# Patient Record
Sex: Female | Born: 1935 | Race: White | Hispanic: No | Smoking: Never smoker
Health system: Southern US, Community
[De-identification: ages and names within clinical notes are randomized; demographics above are authoritative.]

## PROBLEM LIST (undated history)

## (undated) DIAGNOSIS — K862 Cyst of pancreas: Secondary | ICD-10-CM

## (undated) DIAGNOSIS — I10 Essential (primary) hypertension: Secondary | ICD-10-CM

## (undated) DIAGNOSIS — E785 Hyperlipidemia, unspecified: Secondary | ICD-10-CM

## (undated) DIAGNOSIS — G4733 Obstructive sleep apnea (adult) (pediatric): Secondary | ICD-10-CM

## (undated) DIAGNOSIS — E039 Hypothyroidism, unspecified: Secondary | ICD-10-CM

## (undated) DIAGNOSIS — N182 Chronic kidney disease, stage 2 (mild): Secondary | ICD-10-CM

## (undated) DIAGNOSIS — C50919 Malignant neoplasm of unspecified site of unspecified female breast: Secondary | ICD-10-CM

## (undated) DIAGNOSIS — I340 Nonrheumatic mitral (valve) insufficiency: Secondary | ICD-10-CM

## (undated) HISTORY — DX: Cyst of pancreas: K86.2

## (undated) HISTORY — DX: Obstructive sleep apnea (adult) (pediatric): G47.33

## (undated) HISTORY — DX: Nonrheumatic mitral (valve) insufficiency: I34.0

## (undated) HISTORY — PX: BREAST SURGERY: SHX581

## (undated) HISTORY — DX: Essential (primary) hypertension: I10

## (undated) HISTORY — DX: Malignant neoplasm of unspecified site of unspecified female breast: C50.919

## (undated) HISTORY — DX: Hypothyroidism, unspecified: E03.9

## (undated) HISTORY — DX: Hyperlipidemia, unspecified: E78.5

## (undated) HISTORY — DX: Chronic kidney disease, stage 2 (mild): N18.2

---

## 2019-10-10 ENCOUNTER — Inpatient Hospital Stay: Payer: Medicare Other | Attending: Oncology | Admitting: Oncology

## 2019-10-10 ENCOUNTER — Other Ambulatory Visit: Payer: Self-pay

## 2019-10-10 ENCOUNTER — Inpatient Hospital Stay: Payer: Medicare Other

## 2019-10-10 VITALS — BP 127/90 | HR 80 | Temp 97.3°F | Resp 16 | Wt 117.4 lb

## 2019-10-10 DIAGNOSIS — Z7901 Long term (current) use of anticoagulants: Secondary | ICD-10-CM | POA: Insufficient documentation

## 2019-10-10 DIAGNOSIS — R222 Localized swelling, mass and lump, trunk: Secondary | ICD-10-CM | POA: Diagnosis not present

## 2019-10-10 DIAGNOSIS — F0390 Unspecified dementia without behavioral disturbance: Secondary | ICD-10-CM | POA: Insufficient documentation

## 2019-10-10 DIAGNOSIS — N189 Chronic kidney disease, unspecified: Secondary | ICD-10-CM | POA: Insufficient documentation

## 2019-10-10 DIAGNOSIS — Z853 Personal history of malignant neoplasm of breast: Secondary | ICD-10-CM | POA: Insufficient documentation

## 2019-10-10 DIAGNOSIS — I82A11 Acute embolism and thrombosis of right axillary vein: Secondary | ICD-10-CM

## 2019-10-10 DIAGNOSIS — I82621 Acute embolism and thrombosis of deep veins of right upper extremity: Secondary | ICD-10-CM

## 2019-10-10 DIAGNOSIS — I129 Hypertensive chronic kidney disease with stage 1 through stage 4 chronic kidney disease, or unspecified chronic kidney disease: Secondary | ICD-10-CM

## 2019-10-10 DIAGNOSIS — E039 Hypothyroidism, unspecified: Secondary | ICD-10-CM | POA: Diagnosis not present

## 2019-10-10 DIAGNOSIS — R2231 Localized swelling, mass and lump, right upper limb: Secondary | ICD-10-CM

## 2019-10-10 DIAGNOSIS — Z79899 Other long term (current) drug therapy: Secondary | ICD-10-CM | POA: Diagnosis not present

## 2019-10-10 NOTE — Progress Notes (Signed)
Hematology/Oncology Consult note [facility] [phone] [phone]  Patient Care Team: Clide Deutscher, [place] as PCP - General (General Practice)   Name of the patient: Savannah Galvan  [id number]  Jan 03, 1930    Reason for referral-history of breast cancer now with right axillary mass   Referring physician-Kathy Ronnald Ramp, [place]  Date of visit: 10/10/19   History of presenting illness- Patient is a 86 year old female with a past medical history significant for hypertension hypothyroidism dementia CKD and history of breast cancer.  She has a history of right upper extremity lymphedema and was noted to have significant right upper extremity swelling since April 2021.  She underwent right upper extremity/axillary ultrasound which showed DVT involving the right subclavian and basilic veins.  Echogenic density noted in the right axilla 3.8 cm.  She has been referred for further management.Labs from June 2021 revealed normal CMP.  CBC at that time was also unremarkable.  Patient lives at an assisted living in [place] and has some dementia and does not remember all of her history.  Her healthcare power of attorney who is her grandniece is with her today.  Patient does not have any children of her own.  She has 2 other sisters were also involved in her healthcare and one of them lives in [place].  They both did not have any details of her breast cancer which was about 15 to 20 years ago.  Patient does not remember getting any chemotherapy.  She does not remember going on any hormone therapy either.  We do not have her prior medical history and medication list for [place] to review at the time of our visit.  ECOG PS- 1  Pain scale- 0   Review of systems- Review of Systems  Constitutional: Negative for chills, fever, malaise/fatigue and weight loss.  HENT: Negative for congestion, ear discharge and nosebleeds.   Eyes: Negative for blurred vision.  Respiratory:  Negative for cough, hemoptysis, sputum production, shortness of breath and wheezing.   Cardiovascular: Negative for chest pain, palpitations, orthopnea and claudication.  Gastrointestinal: Negative for abdominal pain, blood in stool, constipation, diarrhea, heartburn, melena, nausea and vomiting.  Genitourinary: Negative for dysuria, flank pain, frequency, hematuria and urgency.  Musculoskeletal: Negative for back pain, joint pain and myalgias.       Right arm swelling  Skin: Negative for rash.  Neurological: Negative for dizziness, tingling, focal weakness, seizures, weakness and headaches.  Endo/Heme/Allergies: Does not bruise/bleed easily.  Psychiatric/Behavioral: Negative for depression and suicidal ideas. The patient does not have insomnia.     Not on File  There are no problems to display for this patient.    No past medical history on file.   The histories are not reviewed yet. Please review them in the "History" navigator section and refresh this [place].  Social History   Socioeconomic History  . Marital status: Widowed    Spouse name: Not on file  . Number of children: Not on file  . Years of education: Not on file  . Highest education level: Not on file  Occupational History  . Not on file  Tobacco Use  . Smoking status: Not on file  Substance and Sexual Activity  . Alcohol use: Not on file  . Drug use: Not on file  . Sexual activity: Not on file  Other Topics Concern  . Not on file  Social History Narrative  . Not on file   Social Determinants of Health   Financial Resource Strain:   .  Difficulty of Paying Living Expenses:   Food Insecurity:   . Worried About Charity fundraiser in the Last Year:   . Arboriculturist in the Last Year:   Transportation Needs:   . Film/video editor (Medical):   Marland Kitchen Lack of Transportation (Non-Medical):   Physical Activity:   . Days of Exercise per Week:   . Minutes of Exercise per Session:   Stress:   . Feeling  of Stress :   Social Connections:   . Frequency of Communication with Friends and Family:   . Frequency of Social Gatherings with Friends and Family:   . Attends Religious Services:   . Active Member of Clubs or Organizations:   . Attends Archivist Meetings:   Marland Kitchen Marital Status:   Intimate Partner Violence:   . Fear of Current or Ex-Partner:   . Emotionally Abused:   Marland Kitchen Physically Abused:   . Sexually Abused:      No family history on file.   Current Outpatient Medications:  .  atorvastatin (LIPITOR) 10 MG tablet, Take 10 mg by mouth daily., Disp: , Rfl:  .  ELIQUIS 5 MG TABS tablet, Take by mouth., Disp: , Rfl:  .  ezetimibe (ZETIA) 10 MG tablet, Take 10 mg by mouth daily., Disp: , Rfl:  .  hydrochlorothiazide (HYDRODIURIL) 25 MG tablet, Take 25 mg by mouth daily., Disp: , Rfl:  .  losartan (COZAAR) 50 MG tablet, Take 50 mg by mouth 2 (two) times daily., Disp: , Rfl:  .  mupirocin ointment (BACTROBAN) 2 %, Apply 1 application topically 3 (three) times daily., Disp: , Rfl:  .  MYRBETRIQ 50 MG TB24 tablet, Take 50 mg by mouth daily., Disp: , Rfl:    Physical exam:  Vitals:   10/10/19 1406  BP: 127/90  Pulse: 80  Resp: 16  Temp: (!) 97.3 F (36.3 C)  TempSrc: Tympanic  SpO2: 98%  Weight: 117 lb 6.4 oz (53.3 kg)   Physical Exam Constitutional:      General: She is not in acute distress. Cardiovascular:     Rate and Rhythm: Normal rate and regular rhythm.     Heart sounds: Normal heart sounds.  Pulmonary:     Effort: Pulmonary effort is normal.     Breath sounds: Normal breath sounds.  Abdominal:     General: Bowel sounds are normal.     Palpations: Abdomen is soft.  Musculoskeletal:     Comments: Right upper extremities is significantly more swollen than the left.  There are also superficial skin nodules noted over the area of the right shoulder joint medially.  Skin:    General: Skin is warm and dry.  Neurological:     Mental Status: She is alert and  oriented to person, place, and time.   Patient is s/p bilateral mastectomy without reconstruction.  There is no evidence of chest wall recurrence.  I cannot palpate any distinct right axillary mass.  No left axillary adenopathy.    No flowsheet data found. No flowsheet data found.  No images are attached to the encounter.  No results found.  Assessment and plan- Patient is a 86 y.o. female with prior history of breast cancer now presenting with right upper extremity DVT and right axillary mass  Right axillary mass: Patient has multiple skin nodules noted over her right shoulder which is concerning for recurrent malignancy.  She will need an ultrasound-guided biopsy for this which we will arrange.  We'll also  touch base with IR if she needs to come off Xarelto for the procedure.  I will obtain a PET CT scan given her prior history of breast cancer and ongoing concerns of recurrent malignancy  Patient will continue Xarelto for right subclavian and basilic vein thrombosis  I will see her after her PET scan and biopsy results are back to discuss further management   Thank you for this kind referral and the opportunity to participate in the care of this patient   Visit Diagnosis 1. Axillary mass, right   2. Acute deep vein thrombosis (DVT) of axillary vein of right upper extremity (HCC)     Dr. Randa Evens, MD, MPH [facility] [phone] 10/10/2019

## 2019-10-11 ENCOUNTER — Encounter: Payer: Self-pay | Admitting: Oncology

## 2019-10-15 ENCOUNTER — Encounter: Payer: Self-pay | Admitting: Oncology

## 2019-10-15 ENCOUNTER — Other Ambulatory Visit: Payer: Self-pay

## 2019-10-21 ENCOUNTER — Other Ambulatory Visit: Payer: Self-pay | Admitting: Oncology

## 2019-10-21 ENCOUNTER — Other Ambulatory Visit: Payer: Self-pay | Admitting: *Deleted

## 2019-10-21 ENCOUNTER — Ambulatory Visit
Admission: RE | Admit: 2019-10-21 | Discharge: 2019-10-21 | Disposition: A | Discharge status: Home / Self Care | Payer: Medicare Other | Source: Ambulatory Visit | Attending: Oncology | Admitting: Oncology

## 2019-10-21 ENCOUNTER — Other Ambulatory Visit: Payer: Self-pay

## 2019-10-21 DIAGNOSIS — Z853 Personal history of malignant neoplasm of breast: Secondary | ICD-10-CM | POA: Insufficient documentation

## 2019-10-21 DIAGNOSIS — J90 Pleural effusion, not elsewhere classified: Secondary | ICD-10-CM | POA: Insufficient documentation

## 2019-10-21 DIAGNOSIS — R2231 Localized swelling, mass and lump, right upper limb: Secondary | ICD-10-CM | POA: Insufficient documentation

## 2019-10-21 DIAGNOSIS — R234 Changes in skin texture: Secondary | ICD-10-CM | POA: Diagnosis not present

## 2019-10-21 LAB — GLUCOSE, CAPILLARY: Glucose-Capillary: 100 mg/dL — ABNORMAL HIGH (ref 70–99)

## 2019-10-21 MED ORDER — FLUDEOXYGLUCOSE F - 18 (FDG) INJECTION
6.1000 | Freq: Once | INTRAVENOUS | Status: AC | PRN
  Administered 2019-10-21: 5.99 via INTRAVENOUS

## 2019-10-21 NOTE — Progress Notes (Signed)
Tumor bo

## 2019-10-22 ENCOUNTER — Telehealth: Payer: Self-pay | Admitting: *Deleted

## 2019-10-22 NOTE — Progress Notes (Signed)
I have requested to have a thoracentesis. She needs a covid test done and her niece will get her there tom. I have called the pre admit and got her on the list for the test. I have called janet and left her a message if she can have the thoracentesis Friday afternoon. I am also working on talking to Dr. Dahlia Byes about biopsy for the pt.

## 2019-10-22 NOTE — Telephone Encounter (Signed)
Dr. Janese Banks has asked me to call the niece and speak to her because she is the healthcare power of attorney and determine whether or not she is okay with patient having a thoracentesis because she has a pleural effusion.  We are also still looking into getting a biopsy of the nodules that is on her right arm.  Interventional radiologist feels like that that is something that they would not be able to do so we are now getting in touch with the surgeon.  I have asked the niece to please call me so we can discuss this matter and see how we can move forward.

## 2019-10-23 ENCOUNTER — Other Ambulatory Visit: Payer: Self-pay

## 2019-10-23 ENCOUNTER — Other Ambulatory Visit
Admission: RE | Admit: 2019-10-23 | Discharge: 2019-10-23 | Disposition: A | Discharge status: Home / Self Care | Payer: Medicare Other | Source: Ambulatory Visit | Attending: Oncology | Admitting: Oncology

## 2019-10-23 ENCOUNTER — Telehealth: Payer: Self-pay | Admitting: *Deleted

## 2019-10-23 DIAGNOSIS — Z20822 Contact with and (suspected) exposure to covid-19: Secondary | ICD-10-CM | POA: Diagnosis not present

## 2019-10-23 DIAGNOSIS — Z01812 Encounter for preprocedural laboratory examination: Secondary | ICD-10-CM | POA: Insufficient documentation

## 2019-10-23 LAB — SARS CORONAVIRUS 2 (TAT 6-24 HRS): SARS Coronavirus 2: NEGATIVE

## 2019-10-23 NOTE — Telephone Encounter (Signed)
I called and spoke to [facility], and told her that we are suppose to get the covid test back by lunch time. We have made an appt for tom arrive [facility]. She can eat and drink they will give her local anesthesia to the sire the needle will go in. Meghan will tell her grandmother and she will bring her tom. If we do not get results or covid or if it is positive , I will call Meghan back. She is agreeable to this plan

## 2019-10-24 ENCOUNTER — Other Ambulatory Visit: Payer: Self-pay | Admitting: *Deleted

## 2019-10-24 ENCOUNTER — Ambulatory Visit
Admission: RE | Admit: 2019-10-24 | Discharge: 2019-10-24 | Disposition: A | Discharge status: Home / Self Care | Payer: Medicare Other | Source: Ambulatory Visit | Attending: Interventional Radiology | Admitting: Interventional Radiology

## 2019-10-24 ENCOUNTER — Ambulatory Visit
Admission: RE | Admit: 2019-10-24 | Discharge: 2019-10-24 | Disposition: A | Discharge status: Home / Self Care | Payer: Medicare Other | Source: Ambulatory Visit | Attending: Oncology | Admitting: Oncology

## 2019-10-24 DIAGNOSIS — J90 Pleural effusion, not elsewhere classified: Secondary | ICD-10-CM | POA: Diagnosis present

## 2019-10-24 DIAGNOSIS — Z9889 Other specified postprocedural states: Secondary | ICD-10-CM

## 2019-10-24 NOTE — Procedures (Signed)
Interventional Radiology Procedure Note  Procedure: US guided right thoracentesis  Complications: None  Estimated Blood Loss: None  Findings: 1.5 L of yellow fluid removed from right pleural space. Post CXR pending.  Venetia Night. Kathlene Cote, M.D Pager:  [phone]

## 2019-10-28 ENCOUNTER — Telehealth: Payer: Self-pay | Admitting: *Deleted

## 2019-10-28 NOTE — Telephone Encounter (Signed)
I got information from dr byrnett and he said that she does not have to be NPO for bx. , she does not need to hold her blood thinners. Savannah Galvan will be called from [facility] clinic once her aunt is in the office to get verbal consent. Her sister will bring her tom at 2:15.Savannah Galvan is agreeable to this

## 2019-10-29 ENCOUNTER — Other Ambulatory Visit: Payer: Self-pay | Admitting: Oncology

## 2019-10-29 ENCOUNTER — Telehealth: Payer: Self-pay | Admitting: Internal Medicine

## 2019-10-29 NOTE — Telephone Encounter (Signed)
On 8/04-I spoke to Dr. Augustine Radar pleural fluid positive for malignancy; suggestive of breast cancer. Receptor studies pending.  Reviewed the records; given the diagnosis of malignancy, I agree with Dr. Tollie Pizza to hold off skin biopsy. However, as per Dr. Tollie Pizza skin biopsy could be done if needed.   GB

## 2019-11-03 LAB — CYTOLOGY - NON PAP

## 2019-11-06 ENCOUNTER — Other Ambulatory Visit: Payer: Federal, State, Local not specified - PPO

## 2019-11-06 ENCOUNTER — Inpatient Hospital Stay: Payer: Medicare Other | Attending: Oncology | Admitting: Oncology

## 2019-11-06 ENCOUNTER — Encounter: Payer: Self-pay | Admitting: Oncology

## 2019-11-06 ENCOUNTER — Other Ambulatory Visit: Payer: Self-pay

## 2019-11-06 ENCOUNTER — Inpatient Hospital Stay: Payer: Medicare Other

## 2019-11-06 VITALS — BP 101/61 | HR 81 | Temp 98.2°F | Resp 16 | Ht 66.0 in | Wt 116.5 lb

## 2019-11-06 DIAGNOSIS — C778 Secondary and unspecified malignant neoplasm of lymph nodes of multiple regions: Secondary | ICD-10-CM | POA: Insufficient documentation

## 2019-11-06 DIAGNOSIS — R234 Changes in skin texture: Secondary | ICD-10-CM | POA: Insufficient documentation

## 2019-11-06 DIAGNOSIS — Z7901 Long term (current) use of anticoagulants: Secondary | ICD-10-CM | POA: Insufficient documentation

## 2019-11-06 DIAGNOSIS — Z885 Allergy status to narcotic agent status: Secondary | ICD-10-CM | POA: Diagnosis not present

## 2019-11-06 DIAGNOSIS — E039 Hypothyroidism, unspecified: Secondary | ICD-10-CM | POA: Diagnosis not present

## 2019-11-06 DIAGNOSIS — R5383 Other fatigue: Secondary | ICD-10-CM | POA: Diagnosis not present

## 2019-11-06 DIAGNOSIS — I129 Hypertensive chronic kidney disease with stage 1 through stage 4 chronic kidney disease, or unspecified chronic kidney disease: Secondary | ICD-10-CM | POA: Insufficient documentation

## 2019-11-06 DIAGNOSIS — I82621 Acute embolism and thrombosis of deep veins of right upper extremity: Secondary | ICD-10-CM | POA: Diagnosis not present

## 2019-11-06 DIAGNOSIS — Z9013 Acquired absence of bilateral breasts and nipples: Secondary | ICD-10-CM | POA: Insufficient documentation

## 2019-11-06 DIAGNOSIS — Z7189 Other specified counseling: Secondary | ICD-10-CM

## 2019-11-06 DIAGNOSIS — F0280 Dementia in other diseases classified elsewhere without behavioral disturbance: Secondary | ICD-10-CM | POA: Insufficient documentation

## 2019-11-06 DIAGNOSIS — C50919 Malignant neoplasm of unspecified site of unspecified female breast: Secondary | ICD-10-CM

## 2019-11-06 DIAGNOSIS — N182 Chronic kidney disease, stage 2 (mild): Secondary | ICD-10-CM | POA: Insufficient documentation

## 2019-11-06 DIAGNOSIS — Z79899 Other long term (current) drug therapy: Secondary | ICD-10-CM | POA: Insufficient documentation

## 2019-11-06 DIAGNOSIS — I700 Atherosclerosis of aorta: Secondary | ICD-10-CM | POA: Insufficient documentation

## 2019-11-06 DIAGNOSIS — J910 Malignant pleural effusion: Secondary | ICD-10-CM | POA: Diagnosis not present

## 2019-11-06 DIAGNOSIS — K7689 Other specified diseases of liver: Secondary | ICD-10-CM | POA: Insufficient documentation

## 2019-11-06 DIAGNOSIS — R0602 Shortness of breath: Secondary | ICD-10-CM | POA: Diagnosis not present

## 2019-11-06 DIAGNOSIS — M7989 Other specified soft tissue disorders: Secondary | ICD-10-CM | POA: Diagnosis not present

## 2019-11-06 DIAGNOSIS — Z170 Estrogen receptor positive status [ER+]: Secondary | ICD-10-CM | POA: Diagnosis not present

## 2019-11-06 LAB — COMPREHENSIVE METABOLIC PANEL
ALT: 36 U/L (ref 0–44)
AST: 33 U/L (ref 15–41)
Albumin: 3.5 g/dL (ref 3.5–5.0)
Alkaline Phosphatase: 87 U/L (ref 38–126)
Anion gap: 10 (ref 5–15)
BUN: 15 mg/dL (ref 8–23)
CO2: 28 mmol/L (ref 22–32)
Calcium: 9.2 mg/dL (ref 8.9–10.3)
Chloride: 95 mmol/L — ABNORMAL LOW (ref 98–111)
Creatinine, Ser: 0.66 mg/dL (ref 0.44–1.00)
GFR calc Af Amer: >60 mL/min (ref >60)
GFR calc non Af Amer: >60 mL/min (ref >60)
Glucose, Bld: 131 mg/dL — ABNORMAL HIGH (ref 70–99)
Potassium: 4 mmol/L (ref 3.5–5.1)
Sodium: 133 mmol/L — ABNORMAL LOW (ref 135–145)
Total Bilirubin: 0.7 mg/dL (ref 0.3–1.2)
Total Protein: 6.9 g/dL (ref 6.5–8.1)

## 2019-11-06 LAB — CBC WITH DIFFERENTIAL/PLATELET
Abs Immature Granulocytes: 0.03 10*3/uL (ref 0.00–0.07)
Basophils Absolute: 0.1 10*3/uL (ref 0.0–0.1)
Basophils Relative: 1 %
Eosinophils Absolute: 0.3 10*3/uL (ref 0.0–0.5)
Eosinophils Relative: 3 %
HCT: 41.4 % (ref 36.0–46.0)
Hemoglobin: 14.3 g/dL (ref 12.0–15.0)
Immature Granulocytes: 0 %
Lymphocytes Relative: 15 %
Lymphs Abs: 1.3 10*3/uL (ref 0.7–4.0)
MCH: 31.2 pg (ref 26.0–34.0)
MCHC: 34.5 g/dL (ref 30.0–36.0)
MCV: 90.2 fL (ref 80.0–100.0)
Monocytes Absolute: 0.6 10*3/uL (ref 0.1–1.0)
Monocytes Relative: 7 %
Neutro Abs: 6.3 10*3/uL (ref 1.7–7.7)
Neutrophils Relative %: 74 %
Platelets: 352 10*3/uL (ref 150–400)
RBC: 4.59 MIL/uL (ref 3.87–5.11)
RDW: 13.3 % (ref 11.5–15.5)
WBC: 8.5 10*3/uL (ref 4.0–10.5)
nRBC: 0 % (ref 0.0–0.2)

## 2019-11-06 MED ORDER — LETROZOLE 2.5 MG PO TABS
2.5000 mg | ORAL_TABLET | Freq: Every day | ORAL | 3 refills | Status: DC
Start: 2019-11-06 — End: 2019-12-11

## 2019-11-06 NOTE — Progress Notes (Signed)
Pt states and her sister also that the right arm with clot and a mass in it that it is better looking today as far as swelling decreased

## 2019-11-06 NOTE — Progress Notes (Signed)
Hematology/Oncology Consult note [facility]  [phone] [phone]  Patient Care Team: Clide Deutscher, [place] as PCP - General (General Practice)   Name of the patient: Savannah Galvan  [id number]  12-19-1929   Date of visit: 11/06/19  Diagnosis-metastatic ER positive breast cancer with metastases to mediastinal lymph nodes, bilateral pleural effusion, pectoralis mass causing right upper extremity DVT  Chief complaint/ Reason for visit-discuss pathology results and further management  Heme/Onc history: Patient is a 86 year old female with a past medical history significant for hypertension hypothyroidism dementia CKD and history of breast cancer.  She has a history of right upper extremity lymphedema and was noted to have significant right upper extremity swelling since April 2021.  She underwent right upper extremity/axillary ultrasound which showed DVT involving the right subclavian and basilic veins.  Echogenic density noted in the right axilla 3.8 cm.  She has been referred for further management.Labs from June 2021 revealed normal CMP.  CBC at that time was also unremarkable.  Patient lives at an assisted living in [place] and has some dementia and does not remember all of her history.  Her healthcare power of attorney who is her grandniece is with her today.  Patient does not have any children of her own.  She has 2 other sisters were also involved in her healthcare and one of them lives in [place]. Patient has a history of right breast cancer back in 2008 s/p bilateral mastectomy. She did not receive any adjuvant chemotherapy or radiation. She went on to take 5 years of tamoxifen.  We do not have her prior medical history and medication list for [place] to review at the time of our visit.  PET scan showed a confluent soft tissue thickening involving the right pectoralis muscle, overlying nodular skin thickening both of which showed  hypermetabolism. Small mediastinal lymph nodes showing marked hypermetabolism and large right and moderate left pleural effusion with bibasilar collapse/consolidative opacity.  Right-sided thoracentesis was positive for metastatic breast cancer ER/PR positive and HER-2 negative   Interval history-patient reports exertional shortness of breath on mild to moderate exertion. She is currently on Eliquis for right upper extremity DVT. She does have some discomfort in the area of skin nodules which at times bleeds superficially and she needs a dressing to be placed over it.  ECOG PS- 2 Pain scale- 0   Review of systems- Review of Systems  Constitutional: Positive for malaise/fatigue and weight loss. Negative for chills and fever.  HENT: Negative for congestion, ear discharge and nosebleeds.   Eyes: Negative for blurred vision.  Respiratory: Positive for shortness of breath. Negative for cough, hemoptysis, sputum production and wheezing.   Cardiovascular: Negative for chest pain, palpitations, orthopnea and claudication.  Gastrointestinal: Negative for abdominal pain, blood in stool, constipation, diarrhea, heartburn, melena, nausea and vomiting.  Genitourinary: Negative for dysuria, flank pain, frequency, hematuria and urgency.  Musculoskeletal: Negative for back pain, joint pain and myalgias.  Skin: Negative for rash.  Neurological: Negative for dizziness, tingling, focal weakness, seizures, weakness and headaches.  Endo/Heme/Allergies: Does not bruise/bleed easily.  Psychiatric/Behavioral: Negative for depression and suicidal ideas. The patient does not have insomnia.       Allergies  Allergen Reactions  . Demerol [Meperidine] Nausea And Vomiting     Past Medical History:  Diagnosis Date  . Breast cancer ([place])   . Chronic kidney disease (CKD), stage II (mild)   . Cyst of pancreas   . Hyperlipidemia, unspecified   .  Hypertension   . Hypothyroidism, unspecified   . Nonrheumatic  mitral (valve) insufficiency   . Obstructive sleep apnea (adult) (pediatric)      History reviewed. No pertinent surgical history.  Social History   Socioeconomic History  . Marital status: Widowed    Spouse name: Not on file  . Number of children: Not on file  . Years of education: Not on file  . Highest education level: Not on file  Occupational History  . Not on file  Tobacco Use  . Smoking status: Never Smoker  . Smokeless tobacco: Never Used  Vaping Use  . Vaping Use: Never used  Substance and Sexual Activity  . Alcohol use: Yes    Comment: at supper when she wants it  . Drug use: Never  . Sexual activity: Not Currently  Other Topics Concern  . Not on file  Social History Narrative  . Not on file   Social Determinants of Health   Financial Resource Strain:   . Difficulty of Paying Living Expenses:   Food Insecurity:   . Worried About Charity fundraiser in the Last Year:   . Arboriculturist in the Last Year:   Transportation Needs:   . Film/video editor (Medical):   Marland Kitchen Lack of Transportation (Non-Medical):   Physical Activity:   . Days of Exercise per Week:   . Minutes of Exercise per Session:   Stress:   . Feeling of Stress :   Social Connections:   . Frequency of Communication with Friends and Family:   . Frequency of Social Gatherings with Friends and Family:   . Attends Religious Services:   . Active Member of Clubs or Organizations:   . Attends Archivist Meetings:   Marland Kitchen Marital Status:   Intimate Partner Violence:   . Fear of Current or Ex-Partner:   . Emotionally Abused:   Marland Kitchen Physically Abused:   . Sexually Abused:     No family history on file.   Current Outpatient Medications:  .  atorvastatin (LIPITOR) 10 MG tablet, Take 10 mg by mouth daily., Disp: , Rfl:  .  ELIQUIS 5 MG TABS tablet, Take by mouth., Disp: , Rfl:  .  ezetimibe (ZETIA) 10 MG tablet, Take 10 mg by mouth daily., Disp: , Rfl:  .  hydrochlorothiazide  (HYDRODIURIL) 25 MG tablet, Take 25 mg by mouth daily., Disp: , Rfl:  .  Krill Oil 500 MG CAPS, Take 500 mg by mouth daily., Disp: , Rfl:  .  losartan (COZAAR) 50 MG tablet, Take 50 mg by mouth 2 (two) times daily., Disp: , Rfl:  .  Multiple Vitamin (MULTIVITAMIN) tablet, Take 1 tablet by mouth daily., Disp: , Rfl:  .  mupirocin ointment (BACTROBAN) 2 %, Apply 1 application topically 3 (three) times daily., Disp: , Rfl:  .  MYRBETRIQ 50 MG TB24 tablet, Take 50 mg by mouth daily., Disp: , Rfl:  .  Nutritional Supplements (NUTRITIONAL SHAKE PLUS PROTEIN PO), Take by mouth daily., Disp: , Rfl:  .  vitamin B-12 (CYANOCOBALAMIN) 1000 MCG tablet, Take 1,000 mcg by mouth daily., Disp: , Rfl:  .  letrozole (FEMARA) 2.5 MG tablet, Take 1 tablet (2.5 mg total) by mouth daily., Disp: 30 tablet, Rfl: 3  Physical exam:  Vitals:   11/06/19 1014  BP: 101/61  Pulse: 81  Resp: 16  Temp: 98.2 F (36.8 C)  TempSrc: Oral  Weight: 116 lb 8 oz (52.8 kg)  Height: '5\' 6"'  (1.676  m)   Physical Exam Constitutional:      General: She is not in acute distress.    Comments: Sitting in a wheelchair appears fatigued  Cardiovascular:     Heart sounds: Normal heart sounds.  Pulmonary:     Effort: Pulmonary effort is normal.     Comments: Breath sounds decreased at the bases right greater than left Musculoskeletal:     Comments: Left upper extremity more swollen than the right and superficial skin nodules noted over the medial aspect of the right shoulder  Skin:    General: Skin is warm and dry.  Neurological:     Mental Status: She is alert and oriented to person, place, and time.      CMP Latest Ref Rng & Units 11/06/2019  Glucose 70 - 99 mg/dL 131(H)  BUN 8 - 23 mg/dL 15  Creatinine 0.44 - 1.00 mg/dL 0.66  Sodium 135 - 145 mmol/L 133(L)  Potassium 3.5 - 5.1 mmol/L 4.0  Chloride 98 - 111 mmol/L 95(L)  CO2 22 - 32 mmol/L 28  Calcium 8.9 - 10.3 mg/dL 9.2  Total Protein 6.5 - 8.1 g/dL 6.9  Total  Bilirubin 0.3 - 1.2 mg/dL 0.7  Alkaline Phos 38 - 126 U/L 87  AST 15 - 41 U/L 33  ALT 0 - 44 U/L 36   CBC Latest Ref Rng & Units 11/06/2019  WBC 4.0 - 10.5 K/uL 8.5  Hemoglobin 12.0 - 15.0 g/dL 14.3  Hematocrit 36 - 46 % 41.4  Platelets 150 - 400 K/uL 352    No images are attached to the encounter.  DG Chest 1 View  Result Date: 10/24/2019 CLINICAL DATA:  Bilateral pleural effusions, right greater than left and history breast carcinoma with evidence by recent PET scan of recurrent disease. EXAM: ULTRASOUND GUIDED RIGHT THORACENTESIS COMPARISON:  None. PROCEDURE: An ultrasound guided thoracentesis was thoroughly discussed with the patient and questions answered. The benefits, risks, alternatives and complications were also discussed. The patient understands and wishes to proceed with the procedure. Written consent was obtained. Ultrasound was performed to localize and mark an adequate pocket of fluid in the right chest. The area was then prepped and draped in the normal sterile fashion. 1% Lidocaine was used for local anesthesia. Under ultrasound guidance a 6 French Safe-T-Centesis catheter was introduced. Thoracentesis was performed. The catheter was removed and a dressing applied. COMPLICATIONS: None FINDINGS: A total of approximately 1.5 L of clear, yellow fluid was removed. A fluid sample was sent for laboratory analysis. IMPRESSION: Successful ultrasound guided right thoracentesis yielding 1.5 L of pleural fluid. Electronically Signed   By: Aletta Edouard M.D.   On: 10/24/2019 16:25   NM PET Image Initial (PI) Skull Base To Thigh  Result Date: 10/21/2019 CLINICAL DATA:  Initial treatment strategy for axillary mass. History of breast cancer. EXAM: NUCLEAR MEDICINE PET SKULL BASE TO THIGH TECHNIQUE: 6.0 mCi F-18 FDG was injected intravenously. Full-ring PET imaging was performed from the skull base to thigh after the radiotracer. CT data was obtained and used for attenuation correction and  anatomic localization. Fasting blood glucose: 100 mg/dl COMPARISON:  None. FINDINGS: Mediastinal blood pool activity: SUV max 2.3 Liver activity: SUV max NA NECK: No hypermetabolic lymph nodes in the neck. Incidental CT findings: none CHEST: Confluent soft tissue attenuation in and around the right pectoralis muscle is markedly hypermetabolic with SUV max = 7.5. Areas of overlying nodular skin thickening are also hypermetabolic. No gross subpectoral or axillary lymphadenopathy evident although assessment is  limited due to positioning and lack of intravenous contrast material. 5 mm short axis prevascular node on image 80/series 3 is hypermetabolic with SUV max = 4.2. 7 mm short axis precarinal node is hypermetabolic with SUV max = 4.9. 8 mm short axis subcarinal node is hypermetabolic with SUV max = 4.8. Uptake in the region of the left atrium is indeterminate. Low level hypermetabolism is seen in the inferior right hilum and associated with the right infrahilar collapse. Incidental CT findings: Moderate to large right and moderate left pleural effusions are associated with collapse/consolidation in both lower lobes. 7 mm nodule identified in the perifissural not lingula on 119/3 without hypermetabolism on PET imaging. Right chest wall and right upper extremity subcutaneous edema noted. ABDOMEN/PELVIS: No abnormal hypermetabolic activity within the liver, pancreas, adrenal glands, or spleen. No hypermetabolic lymph nodes in the abdomen or pelvis. Focal hypermetabolic FDG accumulation is identified in the left posterior perinephric fat, presumably representing uptake by brown fat as there is no underlying soft tissue lesion and no adjacent abnormality in the kidney to suggest misregistration with a renal lesion. Incidental CT findings: 2.5 cm cyst identified in the lateral liver. Other scattered liver cysts evident. There is abdominal aortic atherosclerosis without aneurysm. SKELETON: No focal hypermetabolic activity  to suggest skeletal metastasis. Incidental CT findings: none IMPRESSION: 1. Confluent soft tissue thickening involving the right pectoralis musculature is markedly hypermetabolic suspicious for metastatic disease. Primary neoplasm (sarcoma) is considered less likely but not entirely excluded. The overlying nodular skin thickening also shows hypermetabolism. No definite lymphadenopathy in the right axilla or subpectoral region although assessment is limited by positioning of the upper extremity and lack of intravenous contrast material. 2. Small mediastinal lymph nodes show marked hypermetabolism consistent with metastatic spread. 3. No evidence for hypermetabolic metastatic disease in the neck, abdomen, or pelvis. 4. Large right and moderate left pleural effusions with bibasilar collapse/consolidative opacity. 5. 7 mm lingular nodule shows no hypermetabolism. Attention on follow-up recommended. Electronically Signed   By: Misty Stanley M.D.   On: 10/21/2019 16:23   US THORACENTESIS ASP PLEURAL SPACE W/IMG GUIDE  Result Date: 10/24/2019 CLINICAL DATA:  Bilateral pleural effusions, right greater than left and history breast carcinoma with evidence by recent PET scan of recurrent disease. EXAM: ULTRASOUND GUIDED RIGHT THORACENTESIS COMPARISON:  None. PROCEDURE: An ultrasound guided thoracentesis was thoroughly discussed with the patient and questions answered. The benefits, risks, alternatives and complications were also discussed. The patient understands and wishes to proceed with the procedure. Written consent was obtained. Ultrasound was performed to localize and mark an adequate pocket of fluid in the right chest. The area was then prepped and draped in the normal sterile fashion. 1% Lidocaine was used for local anesthesia. Under ultrasound guidance a 6 French Safe-T-Centesis catheter was introduced. Thoracentesis was performed. The catheter was removed and a dressing applied. COMPLICATIONS: None FINDINGS: A  total of approximately 1.5 L of clear, yellow fluid was removed. A fluid sample was sent for laboratory analysis. IMPRESSION: Successful ultrasound guided right thoracentesis yielding 1.5 L of pleural fluid. Electronically Signed   By: Aletta Edouard M.D.   On: 10/24/2019 16:25     Assessment and plan- Patient is a 86 y.o. female with newly diagnosed metastatic ER positive breast cancer with malignant pleural effusion, mediastinal adenopathy has a left pectoralis mass  1. I have reviewed PET/CT scan images independently and discussed findings with the patient and her 2 sisters as well as her niece who is her healthcare power of  attorney. Cytology from right thoracentesis was consistent with metastatic ER positive breast cancer. She has reasonable burden of disease although she is not in any visceral crisis and does not have any evidence of bone metastases either. Given her age and frailty I would like to start her on letrozole for her ER positive breast cancer. I would also like to add Ibrance 100 mg 3 weeks on 1 week of after she completes radiation treatment.  Discussed risks and benefits of letrozole including all but not limited to hot flashes, mood swings, fatigue, joint pain. Patient understands and agrees to proceed as planned although she does not have complete decision-making capacity. Her grand niece is agreeable with the plan. Treatment will be given with a palliative intent.  Patient would also benefit with concomitant use of Ibrance but given her age I would like to start on 100 mg 3 weeks on and 1 week off. Discussed risks and benefits of Ibrance including all but not limited to fatigue, diarrhea, skin rash, cytopenias and risk of infections. I would like to wait on starting this until I get an opinion from radiation oncology. Patient can continue to take letrozole along with radiation  2. Referral to radiation oncology for palliative management of the right pectoralis mass as well as  superficial skin nodules. The pectoralis mass was likely responsible for her right upper extremity DVT  3. Continue Eliquis for right upper extremity DVT  4. Discussed the need for another biopsy given that there was not sufficient tissue available for NGS testing on the thoracentesis specimen. I will reach out to Dr. Bary Castilla regarding this.  5. Patient again continues to be symptomatic and has exertional shortness of breath. I will repeat a chest x-ray and plan for another thoracentesis at this time. If there is not enough tissue available with this specimen perhaps a repeat biopsy can be held off  I will tentatively see her back in 1 month's time. Will check CBC with differential, CMP, CA 27-29 and CA 15-3 today  Patient and family had multiple insightful questions and all of them were answered to their satisfaction   Visit Diagnosis 1. Metastatic breast cancer ([place])   2. Goals of care, counseling/discussion      Dr. Randa Evens, MD, MPH [facility] at [facility] [phone] 11/06/2019 1:23 PM

## 2019-11-07 ENCOUNTER — Other Ambulatory Visit: Payer: Self-pay

## 2019-11-07 ENCOUNTER — Other Ambulatory Visit
Admission: RE | Admit: 2019-11-07 | Discharge: 2019-11-07 | Disposition: A | Discharge status: Home / Self Care | Payer: Medicare Other | Source: Ambulatory Visit | Attending: Oncology | Admitting: Oncology

## 2019-11-07 ENCOUNTER — Telehealth: Payer: Self-pay | Admitting: *Deleted

## 2019-11-07 ENCOUNTER — Other Ambulatory Visit: Payer: Self-pay | Admitting: *Deleted

## 2019-11-07 DIAGNOSIS — J90 Pleural effusion, not elsewhere classified: Secondary | ICD-10-CM

## 2019-11-07 DIAGNOSIS — Z01812 Encounter for preprocedural laboratory examination: Secondary | ICD-10-CM | POA: Insufficient documentation

## 2019-11-07 DIAGNOSIS — Z20822 Contact with and (suspected) exposure to covid-19: Secondary | ICD-10-CM | POA: Diagnosis not present

## 2019-11-07 DIAGNOSIS — C50919 Malignant neoplasm of unspecified site of unspecified female breast: Secondary | ICD-10-CM

## 2019-11-07 LAB — CANCER ANTIGEN 27.29: CA 27.29: 219.5 U/mL — ABNORMAL HIGH (ref 0.0–38.6)

## 2019-11-07 LAB — CANCER ANTIGEN 15-3: CA 15-3: 215 U/mL — ABNORMAL HIGH (ref 0.0–25.0)

## 2019-11-07 NOTE — Telephone Encounter (Signed)
Called meghan and asked her that if she can bring pt over today for covid test and then her procedure will be mon or tues. meghan will call her grandmother and let her know to [facility], between 8:30 and 12:30 today and the procedure will be either mon or tues. And meghan will call her grandmother and see what day is best. Then let me know

## 2019-11-07 NOTE — Progress Notes (Signed)
Tumor Board Documentation  TAUHEEDAH BOK was presented by Dr Janese Banks at our Tumor Board on 11/06/2019, which included representatives from medical oncology, radiation oncology, surgical, radiology, pathology, navigation, internal medicine, palliative care, research, pulmonology.  Deniz currently presents as a new patient, for [place], for new positive pathology with history of the following treatments: active survellience, surgical intervention(s) (Tamoxifen).  Additionally, we reviewed previous medical and familial history, history of present illness, and recent lab results along with all available histopathologic and imaging studies. The tumor board considered available treatment options and made the following recommendations: Immunotherapy (Letrozole/ [facility]) Refer to Radiation Oncology  The following procedures/referrals were also placed: No orders of the defined types were placed in this encounter.   Clinical Trial Status: not discussed   Staging used: AJCC Stage Group  AJCC Staging:       Group: Stage 4 ER/PR + HER 2 - Breast Cancer   National site-specific guidelines NCCN were discussed with respect to the case.  Tumor board is a meeting of clinicians from various specialty areas who evaluate and discuss patients for whom a multidisciplinary approach is being considered. Final determinations in the plan of care are those of the provider(s). The responsibility for follow up of recommendations given during tumor board is that of the provider.   Todays extended care, comprehensive team conference, Akaila was not present for the discussion and was not examined.   Multidisciplinary Tumor Board is a multidisciplinary case peer review process.  Decisions discussed in the Multidisciplinary Tumor Board reflect the opinions of the specialists present at the conference without having examined the patient.  Ultimately, treatment and diagnostic decisions rest with the primary provider(s) and the  patient.

## 2019-11-08 LAB — SARS CORONAVIRUS 2 (TAT 6-24 HRS): SARS Coronavirus 2: NEGATIVE

## 2019-11-11 ENCOUNTER — Other Ambulatory Visit: Payer: Self-pay

## 2019-11-11 ENCOUNTER — Ambulatory Visit
Admission: RE | Admit: 2019-11-11 | Discharge: 2019-11-11 | Disposition: A | Discharge status: Home / Self Care | Payer: Medicare Other | Source: Ambulatory Visit | Attending: Interventional Radiology | Admitting: Interventional Radiology

## 2019-11-11 ENCOUNTER — Ambulatory Visit
Admission: RE | Admit: 2019-11-11 | Discharge: 2019-11-11 | Disposition: A | Discharge status: Home / Self Care | Payer: Medicare Other | Source: Ambulatory Visit | Attending: Oncology | Admitting: Oncology

## 2019-11-11 ENCOUNTER — Other Ambulatory Visit: Payer: Self-pay | Admitting: Oncology

## 2019-11-11 DIAGNOSIS — Z9889 Other specified postprocedural states: Secondary | ICD-10-CM

## 2019-11-11 DIAGNOSIS — C50919 Malignant neoplasm of unspecified site of unspecified female breast: Secondary | ICD-10-CM

## 2019-11-11 DIAGNOSIS — J90 Pleural effusion, not elsewhere classified: Secondary | ICD-10-CM | POA: Insufficient documentation

## 2019-11-13 ENCOUNTER — Telehealth: Payer: Self-pay | Admitting: *Deleted

## 2019-11-13 LAB — CYTOLOGY - NON PAP

## 2019-11-13 NOTE — Telephone Encounter (Signed)
I told Savannah Galvan when we knew about the fluid results would call her. I have texted her and let her know that the fluid showed cancer but not enough cells to send block for testing to try to find best med to help if any actions with NGS. Dr. Janese Banks had spoke to the pt, the sister and Savannah Galvan-great niece about the possibility of needing a bx to get a specimen to send off for testing. Savannah Galvan texted me back and said yes and that Dr. Bary Castilla  Office called her at 5 pm with appt for 8/24.

## 2019-11-17 ENCOUNTER — Encounter: Payer: Self-pay | Admitting: Radiation Oncology

## 2019-11-17 ENCOUNTER — Ambulatory Visit
Admission: RE | Admit: 2019-11-17 | Discharge: 2019-11-17 | Disposition: A | Discharge status: Home / Self Care | Payer: Medicare Other | Source: Ambulatory Visit | Attending: Radiation Oncology | Admitting: Radiation Oncology

## 2019-11-17 ENCOUNTER — Other Ambulatory Visit: Payer: Self-pay

## 2019-11-17 VITALS — BP 98/69 | HR 93 | Temp 96.3°F | Wt 114.0 lb

## 2019-11-17 DIAGNOSIS — C50911 Malignant neoplasm of unspecified site of right female breast: Secondary | ICD-10-CM | POA: Insufficient documentation

## 2019-11-17 DIAGNOSIS — E785 Hyperlipidemia, unspecified: Secondary | ICD-10-CM | POA: Insufficient documentation

## 2019-11-17 DIAGNOSIS — I129 Hypertensive chronic kidney disease with stage 1 through stage 4 chronic kidney disease, or unspecified chronic kidney disease: Secondary | ICD-10-CM | POA: Insufficient documentation

## 2019-11-17 DIAGNOSIS — Z923 Personal history of irradiation: Secondary | ICD-10-CM | POA: Insufficient documentation

## 2019-11-17 DIAGNOSIS — C50919 Malignant neoplasm of unspecified site of unspecified female breast: Secondary | ICD-10-CM

## 2019-11-17 DIAGNOSIS — I82621 Acute embolism and thrombosis of deep veins of right upper extremity: Secondary | ICD-10-CM | POA: Insufficient documentation

## 2019-11-17 DIAGNOSIS — Z9013 Acquired absence of bilateral breasts and nipples: Secondary | ICD-10-CM | POA: Insufficient documentation

## 2019-11-17 DIAGNOSIS — E039 Hypothyroidism, unspecified: Secondary | ICD-10-CM | POA: Insufficient documentation

## 2019-11-17 DIAGNOSIS — Z7901 Long term (current) use of anticoagulants: Secondary | ICD-10-CM | POA: Insufficient documentation

## 2019-11-17 DIAGNOSIS — F0390 Unspecified dementia without behavioral disturbance: Secondary | ICD-10-CM | POA: Insufficient documentation

## 2019-11-17 DIAGNOSIS — G4730 Sleep apnea, unspecified: Secondary | ICD-10-CM | POA: Insufficient documentation

## 2019-11-17 DIAGNOSIS — I340 Nonrheumatic mitral (valve) insufficiency: Secondary | ICD-10-CM | POA: Insufficient documentation

## 2019-11-17 DIAGNOSIS — Z79899 Other long term (current) drug therapy: Secondary | ICD-10-CM | POA: Insufficient documentation

## 2019-11-17 DIAGNOSIS — Z170 Estrogen receptor positive status [ER+]: Secondary | ICD-10-CM | POA: Insufficient documentation

## 2019-11-17 DIAGNOSIS — N182 Chronic kidney disease, stage 2 (mild): Secondary | ICD-10-CM | POA: Insufficient documentation

## 2019-11-17 NOTE — Consult Note (Signed)
NEW PATIENT EVALUATION  Name: Savannah Galvan  MRN: [id number]  Date:   11/17/2019     DOB: 1929-10-22   This 86 y.o. female patient presents to the clinic for initial evaluation of recurrent breast cancer with large pectoralis mass causing right upper extremity DVT.Marland Kitchen  REFERRING PHYSICIAN: Clide Deutscher, PA  CHIEF COMPLAINT:  Chief Complaint  Patient presents with  . Consult    DIAGNOSIS: The encounter diagnosis was Metastatic breast cancer ([place]).   PREVIOUS INVESTIGATIONS:  PET CT scan reviewed Labs reviewed Clinical notes reviewed  HPI: Patient is an 86 year old female with early dementia who is a past medical history status post bilateral mastectomies in 2008 for history of right breast cancer.  She did not receive any adjuvant chemotherapy or radiation at that time.  She was on tamoxifen therapy for 5 years.  She recently presented with an upper extremity swelling and ultrasound demonstrating a DVT involving the right subclavian vein.  Ultrasound demonstrated a right axillary mass of 3.8 cm.  She underwent a PET CT scan showing confluent soft tissue thickening involving the right pectoralis muscle overlying nodular skin thickening both of which were hypermetabolic.  She also had small mediastinal lymph nodes.  She had significant pleural effusions although she did have thoracentesis last week with some improvement in her breathing status.  I been asked to evaluate her for a palliative course of radiation therapy to the right pectoralis mass.  Cytology of pleural fluid was diagnostic of metastatic carcinoma compatible with breast primary.  She is having no significant pain in her right upper extremity although upper extremities continues to be markedly 6 enlarged.  PLANNED TREATMENT REGIMEN: Palliative radiation therapy to right pectoralis mass  PAST MEDICAL HISTORY:  has a past medical history of Breast cancer ([place]), Chronic kidney disease (CKD), stage II (mild), Cyst of pancreas,  Hyperlipidemia, unspecified, Hypertension, Hypothyroidism, unspecified, Nonrheumatic mitral (valve) insufficiency, and Obstructive sleep apnea (adult) (pediatric).    PAST SURGICAL HISTORY: No past surgical history on file.  FAMILY HISTORY: family history is not on file.  SOCIAL HISTORY:  reports that she has never smoked. She has never used smokeless tobacco. She reports current alcohol use. She reports that she does not use drugs.  ALLERGIES: Demerol [meperidine]  MEDICATIONS:  Current Outpatient Medications  Medication Sig Dispense Refill  . ELIQUIS 5 MG TABS tablet Take by mouth.    . hydrochlorothiazide (HYDRODIURIL) 25 MG tablet Take 25 mg by mouth daily.    Marland Kitchen losartan (COZAAR) 50 MG tablet Take 50 mg by mouth 2 (two) times daily.    Marland Kitchen MYRBETRIQ 50 MG TB24 tablet Take 50 mg by mouth daily.    . Nutritional Supplements (NUTRITIONAL SHAKE PLUS PROTEIN PO) Take by mouth daily.    . vitamin B-12 (CYANOCOBALAMIN) 1000 MCG tablet Take 1,000 mcg by mouth daily.    Marland Kitchen atorvastatin (LIPITOR) 10 MG tablet Take 10 mg by mouth daily. (Patient not taking: Reported on 11/17/2019)    . ezetimibe (ZETIA) 10 MG tablet Take 10 mg by mouth daily. (Patient not taking: Reported on 11/17/2019)    . Krill Oil 500 MG CAPS Take 500 mg by mouth daily. (Patient not taking: Reported on 11/17/2019)    . letrozole (FEMARA) 2.5 MG tablet Take 1 tablet (2.5 mg total) by mouth daily. (Patient not taking: Reported on 11/17/2019) 30 tablet 3  . Multiple Vitamin (MULTIVITAMIN) tablet Take 1 tablet by mouth daily. (Patient not taking: Reported on 11/17/2019)    . mupirocin ointment (  BACTROBAN) 2 % Apply 1 application topically 3 (three) times daily. (Patient not taking: Reported on 11/17/2019)     No current facility-administered medications for this encounter.    ECOG PERFORMANCE STATUS:  1 - Symptomatic but completely ambulatory  REVIEW OF SYSTEMS: Except for the breathing secondary pleural effusions swelling of the  right upper extremity Patient denies any weight loss, fatigue, weakness, fever, chills or night sweats. Patient denies any loss of vision, blurred vision. Patient denies any ringing  of the ears or hearing loss. No irregular heartbeat. Patient denies heart murmur or history of fainting. Patient denies any chest pain or pain radiating to her upper extremities. Patient denies any shortness of breath, difficulty breathing at night, cough or hemoptysis. Patient denies any swelling in the lower legs. Patient denies any nausea vomiting, vomiting of blood, or coffee ground material in the vomitus. Patient denies any stomach pain. Patient states has had normal bowel movements no significant constipation or diarrhea. Patient denies any dysuria, hematuria or significant nocturia. Patient denies any problems walking, swelling in the joints or loss of balance. Patient denies any skin changes, loss of hair or loss of weight. Patient denies any excessive worrying or anxiety or significant depression. Patient denies any problems with insomnia. Patient denies excessive thirst, polyuria, polydipsia. Patient denies any swollen glands, patient denies easy bruising or easy bleeding. Patient denies any recent infections, allergies or URI. Patient "s visual fields have not changed significantly in recent time.   PHYSICAL EXAM: BP 98/69 (BP Location: Left Arm, Patient Position: Sitting, Cuff Size: Normal)   Pulse 93   Temp (!) 96.3 F (35.7 C) (Tympanic)   Wt 114 lb (51.7 kg)   SpO2 96% Comment: room air  BMI 18.40 kg/m  She has significant right upper extremity and swelling.  She does have some fullness in the right supraclavicular fossa.  Some decreased breath sounds in the lung bases bilaterally.  Well-developed well-nourished patient in NAD. HEENT reveals PERLA, EOMI, discs not visualized.  Oral cavity is clear. No oral mucosal lesions are identified. Neck is clear without evidence of cervical or supraclavicular  adenopathy. Lungs are clear to A&P. Cardiac examination is essentially unremarkable with regular rate and rhythm without murmur rub or thrill. Abdomen is benign with no organomegaly or masses noted. Motor sensory and DTR levels are equal and symmetric in the upper and lower extremities. Cranial nerves II through XII are grossly intact. Proprioception is intact. No peripheral adenopathy or edema is identified. No motor or sensory levels are noted. Crude visual fields are within normal range.  LABORATORY DATA: Cytology report reviewed    RADIOLOGY RESULTS: PET CT scan and CT scans reviewed compatible with above-stated findings   IMPRESSION: Recurrent breast cancer with positive cytology in the pleural effusion as well as a right pectoralis major mass causing significant swelling DVT of right upper extremity in 86 year old female  PLAN: At this time elected ahead with palliative radiation therapy to the right pectoralis mass.  Would plan on delivering 3000 cGy in 10 fractions and observe for clinical response risk and benefits of treatment occluding skin reaction fatigue possible alteration of blood counts all were discussed in detail.  I have personally set up for CT simulation later this week.  Patient was accompanied by her sister today they both seem to comprehend my plan well.  I would like to take this opportunity to thank you for allowing me to participate in the care of your patient.Noreene Filbert, MD

## 2019-11-19 ENCOUNTER — Emergency Department: Payer: Medicare Other

## 2019-11-19 ENCOUNTER — Inpatient Hospital Stay
Admission: EM | Admit: 2019-11-19 | Discharge: 2019-11-25 | DRG: 844 | Payer: Medicare Other | Attending: Internal Medicine | Admitting: Internal Medicine

## 2019-11-19 ENCOUNTER — Other Ambulatory Visit: Payer: Self-pay

## 2019-11-19 DIAGNOSIS — E871 Hypo-osmolality and hyponatremia: Secondary | ICD-10-CM | POA: Diagnosis present

## 2019-11-19 DIAGNOSIS — F0390 Unspecified dementia without behavioral disturbance: Secondary | ICD-10-CM | POA: Diagnosis present

## 2019-11-19 DIAGNOSIS — Z170 Estrogen receptor positive status [ER+]: Secondary | ICD-10-CM

## 2019-11-19 DIAGNOSIS — Z79899 Other long term (current) drug therapy: Secondary | ICD-10-CM

## 2019-11-19 DIAGNOSIS — C50919 Malignant neoplasm of unspecified site of unspecified female breast: Secondary | ICD-10-CM | POA: Diagnosis present

## 2019-11-19 DIAGNOSIS — J948 Other specified pleural conditions: Secondary | ICD-10-CM | POA: Diagnosis not present

## 2019-11-19 DIAGNOSIS — J90 Pleural effusion, not elsewhere classified: Secondary | ICD-10-CM | POA: Diagnosis not present

## 2019-11-19 DIAGNOSIS — J939 Pneumothorax, unspecified: Secondary | ICD-10-CM | POA: Diagnosis present

## 2019-11-19 DIAGNOSIS — J9811 Atelectasis: Secondary | ICD-10-CM | POA: Diagnosis present

## 2019-11-19 DIAGNOSIS — Z20822 Contact with and (suspected) exposure to covid-19: Secondary | ICD-10-CM | POA: Diagnosis present

## 2019-11-19 DIAGNOSIS — N182 Chronic kidney disease, stage 2 (mild): Secondary | ICD-10-CM | POA: Diagnosis present

## 2019-11-19 DIAGNOSIS — J910 Malignant pleural effusion: Secondary | ICD-10-CM | POA: Diagnosis present

## 2019-11-19 DIAGNOSIS — Z885 Allergy status to narcotic agent status: Secondary | ICD-10-CM

## 2019-11-19 DIAGNOSIS — Z7901 Long term (current) use of anticoagulants: Secondary | ICD-10-CM

## 2019-11-19 DIAGNOSIS — I340 Nonrheumatic mitral (valve) insufficiency: Secondary | ICD-10-CM | POA: Diagnosis present

## 2019-11-19 DIAGNOSIS — E785 Hyperlipidemia, unspecified: Secondary | ICD-10-CM | POA: Diagnosis present

## 2019-11-19 DIAGNOSIS — Z66 Do not resuscitate: Secondary | ICD-10-CM | POA: Diagnosis present

## 2019-11-19 DIAGNOSIS — E039 Hypothyroidism, unspecified: Secondary | ICD-10-CM | POA: Diagnosis present

## 2019-11-19 DIAGNOSIS — Z79810 Long term (current) use of selective estrogen receptor modulators (SERMs): Secondary | ICD-10-CM

## 2019-11-19 DIAGNOSIS — Z09 Encounter for follow-up examination after completed treatment for conditions other than malignant neoplasm: Secondary | ICD-10-CM

## 2019-11-19 DIAGNOSIS — Z86718 Personal history of other venous thrombosis and embolism: Secondary | ICD-10-CM

## 2019-11-19 DIAGNOSIS — Z9013 Acquired absence of bilateral breasts and nipples: Secondary | ICD-10-CM

## 2019-11-19 DIAGNOSIS — C7989 Secondary malignant neoplasm of other specified sites: Principal | ICD-10-CM | POA: Diagnosis present

## 2019-11-19 DIAGNOSIS — Z4682 Encounter for fitting and adjustment of non-vascular catheter: Secondary | ICD-10-CM

## 2019-11-19 DIAGNOSIS — Z853 Personal history of malignant neoplasm of breast: Secondary | ICD-10-CM

## 2019-11-19 DIAGNOSIS — I82621 Acute embolism and thrombosis of deep veins of right upper extremity: Secondary | ICD-10-CM | POA: Diagnosis present

## 2019-11-19 DIAGNOSIS — R5381 Other malaise: Secondary | ICD-10-CM | POA: Diagnosis present

## 2019-11-19 DIAGNOSIS — G4733 Obstructive sleep apnea (adult) (pediatric): Secondary | ICD-10-CM | POA: Diagnosis present

## 2019-11-19 DIAGNOSIS — R0602 Shortness of breath: Secondary | ICD-10-CM

## 2019-11-19 DIAGNOSIS — I129 Hypertensive chronic kidney disease with stage 1 through stage 4 chronic kidney disease, or unspecified chronic kidney disease: Secondary | ICD-10-CM | POA: Diagnosis present

## 2019-11-19 LAB — BASIC METABOLIC PANEL
Anion gap: 12 (ref 5–15)
BUN: 28 mg/dL — ABNORMAL HIGH (ref 8–23)
CO2: 28 mmol/L (ref 22–32)
Calcium: 9.5 mg/dL (ref 8.9–10.3)
Chloride: 94 mmol/L — ABNORMAL LOW (ref 98–111)
Creatinine, Ser: 0.72 mg/dL (ref 0.44–1.00)
GFR calc Af Amer: >60 mL/min (ref >60)
GFR calc non Af Amer: >60 mL/min (ref >60)
Glucose, Bld: 110 mg/dL — ABNORMAL HIGH (ref 70–99)
Potassium: 3.7 mmol/L (ref 3.5–5.1)
Sodium: 134 mmol/L — ABNORMAL LOW (ref 135–145)

## 2019-11-19 LAB — CBC
HCT: 38.7 % (ref 36.0–46.0)
Hemoglobin: 13.5 g/dL (ref 12.0–15.0)
MCH: 31.7 pg (ref 26.0–34.0)
MCHC: 34.9 g/dL (ref 30.0–36.0)
MCV: 90.8 fL (ref 80.0–100.0)
Platelets: 336 10*3/uL (ref 150–400)
RBC: 4.26 MIL/uL (ref 3.87–5.11)
RDW: 13.4 % (ref 11.5–15.5)
WBC: 7.5 10*3/uL (ref 4.0–10.5)
nRBC: 0 % (ref 0.0–0.2)

## 2019-11-19 LAB — SARS CORONAVIRUS 2 BY RT PCR (HOSPITAL ORDER, PERFORMED IN ~~LOC~~ HOSPITAL LAB): SARS Coronavirus 2: NEGATIVE

## 2019-11-19 MED ORDER — ACETAMINOPHEN 650 MG RE SUPP: 650.0000 mg | Freq: Four times a day (QID) | RECTAL | Status: DC | PRN

## 2019-11-19 MED ORDER — ALBUTEROL SULFATE (2.5 MG/3ML) 0.083% IN NEBU: 2.5000 mg | INHALATION_SOLUTION | RESPIRATORY_TRACT | Status: DC | PRN

## 2019-11-19 MED ORDER — MIRTAZAPINE 15 MG PO TABS
7.5000 mg | ORAL_TABLET | Freq: Every day | ORAL | Status: DC
  Administered 2019-11-19 – 2019-11-24 (×6): 7.5 mg via ORAL
  Filled 2019-11-19 (×6): qty 1

## 2019-11-19 MED ORDER — SODIUM CHLORIDE 0.9% FLUSH: 3.0000 mL | INTRAVENOUS | Status: DC | PRN

## 2019-11-19 MED ORDER — SODIUM CHLORIDE 0.9% FLUSH
3.0000 mL | Freq: Two times a day (BID) | INTRAVENOUS | Status: DC
  Administered 2019-11-19 – 2019-11-25 (×12): 3 mL via INTRAVENOUS

## 2019-11-19 MED ORDER — LETROZOLE 2.5 MG PO TABS
2.5000 mg | ORAL_TABLET | Freq: Every day | ORAL | Status: DC
  Administered 2019-11-19 – 2019-11-25 (×6): 2.5 mg via ORAL
  Filled 2019-11-19 (×7): qty 1

## 2019-11-19 MED ORDER — ADULT MULTIVITAMIN W/MINERALS CH
ORAL_TABLET | Freq: Every day | ORAL | Status: DC
  Administered 2019-11-19 – 2019-11-25 (×5): 1 via ORAL
  Filled 2019-11-19 (×5): qty 1

## 2019-11-19 MED ORDER — POLYETHYLENE GLYCOL 3350 17 G PO PACK: 17.0000 g | PACK | Freq: Every day | ORAL | Status: DC | PRN

## 2019-11-19 MED ORDER — ACETAMINOPHEN 325 MG PO TABS
650.0000 mg | ORAL_TABLET | Freq: Four times a day (QID) | ORAL | Status: DC | PRN
  Administered 2019-11-22 – 2019-11-23 (×3): 650 mg via ORAL
  Filled 2019-11-19 (×3): qty 2

## 2019-11-19 MED ORDER — ONDANSETRON HCL 4 MG PO TABS: 4.0000 mg | ORAL_TABLET | Freq: Four times a day (QID) | ORAL | Status: DC | PRN

## 2019-11-19 MED ORDER — ATORVASTATIN CALCIUM 10 MG PO TABS
10.0000 mg | ORAL_TABLET | Freq: Every day | ORAL | Status: DC
  Administered 2019-11-19 – 2019-11-21 (×2): 10 mg via ORAL
  Filled 2019-11-19 (×2): qty 1

## 2019-11-19 MED ORDER — SODIUM CHLORIDE 0.9 % IV SOLN: 250.0000 mL | INTRAVENOUS | Status: DC | PRN

## 2019-11-19 MED ORDER — SENNA 8.6 MG PO TABS
1.0000 | ORAL_TABLET | Freq: Two times a day (BID) | ORAL | Status: DC
  Administered 2019-11-19 – 2019-11-25 (×11): 8.6 mg via ORAL
  Filled 2019-11-19 (×11): qty 1

## 2019-11-19 MED ORDER — LOSARTAN POTASSIUM 50 MG PO TABS
50.0000 mg | ORAL_TABLET | Freq: Two times a day (BID) | ORAL | Status: DC
  Administered 2019-11-19 – 2019-11-23 (×9): 50 mg via ORAL
  Filled 2019-11-19 (×10): qty 1

## 2019-11-19 MED ORDER — EZETIMIBE 10 MG PO TABS
10.0000 mg | ORAL_TABLET | Freq: Every day | ORAL | Status: DC
  Administered 2019-11-19 – 2019-11-21 (×3): 10 mg via ORAL
  Filled 2019-11-19 (×3): qty 1

## 2019-11-19 MED ORDER — ONDANSETRON HCL 4 MG/2ML IJ SOLN: 4.0000 mg | Freq: Four times a day (QID) | INTRAMUSCULAR | Status: DC | PRN

## 2019-11-19 MED ORDER — MIRABEGRON ER 50 MG PO TB24
50.0000 mg | ORAL_TABLET | Freq: Every day | ORAL | Status: DC
  Administered 2019-11-19 – 2019-11-25 (×6): 50 mg via ORAL
  Filled 2019-11-19 (×7): qty 1

## 2019-11-19 NOTE — ED Notes (Signed)
Patient NPO after midnight, hold Eliquis

## 2019-11-19 NOTE — H&P (Addendum)
[facility] NAME: Savannah Galvan    MR#:  [id number]  DATE OF BIRTH:  April 20, 1929  DATE OF ADMISSION:  11/19/2019  PRIMARY CARE PHYSICIAN: Clide Deutscher, PA   REQUESTING/REFERRING PHYSICIAN:Dr kinner  [facility] coming [facility] [place] no family in the ER  CHIEF COMPLAINT:  patient tells me she is here after a possible fall. Per ER physician patient is here with increasing shortness of breath  HISTORY OF PRESENT ILLNESS:  Savannah Galvan  is a 86 y.o. female with a known history of status post bilateral mastectomy in 2008 four history of right breast cancer. Patient has not received any chemotherapy. She has been on tamoxifen for five years. Hypothyroidism, hypertension, hyperlipidemia,, CKD stage II, dementia, right upper extremity DVT on eliquis  Patient recently underwent  Right sided thoracentesis on July 16 and August 17 by IR. Cytology consistent with metastatic breast cancer. Patient follows with radiation oncology Dr. Donella Stade who is supposed to start radiation therapy from August 26.  ED course: patient is brought in from [facility] with shortness of breath. Her sats are 92 to 98% on room air. She is not exhibiting any signs symptoms of respiratory distress  CXR showsModerate right hydropneumothorax. Similar small to moderate left pleural effusion. Associated bilateral atelectasis. No acute osseous abnormality.  ER physician spoke with Dr. Genevive Bi cardiothoracic recommends interventional radiology to place drain on the left side. Spoke with Dr. Annamaria Boots interventional radiology and patient's previous x-rays did not show any expansion of lung volume after thoracentesis. At this time will repeat chest x-ray in the morning and hold eliquis for now.  PAST MEDICAL HISTORY:   Past Medical History:  Diagnosis Date  . Breast cancer ([place])   . Chronic kidney disease (CKD), stage II (mild)   . Cyst of pancreas   .  Hyperlipidemia, unspecified   . Hypertension   . Hypothyroidism, unspecified   . Nonrheumatic mitral (valve) insufficiency   . Obstructive sleep apnea (adult) (pediatric)     PAST SURGICAL HISTOIRY:  History reviewed. No pertinent surgical history.  SOCIAL HISTORY:   Social History   Tobacco Use  . Smoking status: Never Smoker  . Smokeless tobacco: Never Used  Substance Use Topics  . Alcohol use: Yes    Comment: at supper when she wants it    FAMILY HISTORY:  No family history on file.  DRUG ALLERGIES:   Allergies  Allergen Reactions  . Demerol [Meperidine] Nausea And Vomiting    REVIEW OF SYSTEMS:  Review of Systems  Constitutional: Negative for chills, fever and weight loss.  HENT: Negative for ear discharge, ear pain and nosebleeds.   Eyes: Negative for blurred vision, pain and discharge.  Respiratory: Positive for shortness of breath. Negative for sputum production, wheezing and stridor.   Cardiovascular: Negative for chest pain, palpitations, orthopnea and PND.  Gastrointestinal: Negative for abdominal pain, diarrhea, nausea and vomiting.  Genitourinary: Negative for frequency and urgency.  Musculoskeletal: Positive for falls and joint pain. Negative for back pain.  Neurological: Positive for weakness. Negative for sensory change, speech change and focal weakness.  Psychiatric/Behavioral: Negative for depression and hallucinations. The patient is not nervous/anxious.      MEDICATIONS AT HOME:   Prior to Admission medications   Medication Sig Start Date End Date Taking? Authorizing Provider  atorvastatin (LIPITOR) 10 MG tablet Take 10 mg by mouth daily.  09/02/19  Yes [provider]  ELIQUIS 5 MG TABS tablet  Take 5 mg by mouth 2 (two) times daily.  09/23/19  Yes [provider]  ezetimibe (ZETIA) 10 MG tablet Take 10 mg by mouth daily.  09/18/19  Yes [provider]  letrozole (FEMARA) 2.5 MG tablet Take 1 tablet (2.5 mg total) by mouth  daily. 11/06/19  Yes Sindy Guadeloupe, MD  losartan (COZAAR) 50 MG tablet Take 50 mg by mouth 2 (two) times daily. 09/30/19  Yes [provider]  Multiple Vitamin (MULTIVITAMIN) tablet Take 1 tablet by mouth daily.  07/21/19  Yes Kathryne Hitch, RN  MYRBETRIQ 50 MG TB24 tablet Take 50 mg by mouth daily. 09/22/19  Yes [provider]  hydrochlorothiazide (HYDRODIURIL) 25 MG tablet Take 25 mg by mouth daily. 09/27/19   [provider]  mirtazapine (REMERON) 7.5 MG tablet Take by mouth.    [provider]  Nutritional Supplements (NUTRITIONAL SHAKE PLUS PROTEIN PO) Take by mouth daily.    Kathryne Hitch, RN  vitamin B-12 (CYANOCOBALAMIN) 1000 MCG tablet Take 1,000 mcg by mouth daily. 07/21/19   Kathryne Hitch, RN      VITAL SIGNS:  Blood pressure 137/64, pulse 64, temperature 97.6 F (36.4 C), temperature source Oral, resp. rate 18, height 5\' 6"  (1.676 m), weight 51 kg, SpO2 100 %.  PHYSICAL EXAMINATION:  GENERAL:  86 y.o.-year-old patient lying in the bed with no acute distress. Thin, frail EYES: Pupils equal, round, reactive to light and accommodation. No scleral icterus.  HEENT: Head atraumatic, normocephalic. Oropharynx and nasopharynx clear.  NECK:  Supple, no jugular venous distention. No thyroid enlargement, no tenderness.  LUNGS: decreased breath sounds bilaterally, no wheezing, rales,rhonchi or crepitation. No use of accessory muscles of respiration. Port + right ul no respiratory distress CARDIOVASCULAR: S1, S2 normal. No murmurs, rubs, or gallops.  ABDOMEN: Soft, nontender, nondistended. Bowel sounds present. No organomegaly or mass.  EXTREMITIES: No pedal edema, cyanosis, or clubbing.  NEUROLOGIC: Cranial nerves II through XII are intact. Muscle strength 5/5 in all extremities. Sensation intact. Gait not checked.  PSYCHIATRIC: The patient is alert and oriented x 2.  SKIN: No obvious rash, lesion, or ulcer.   LABORATORY PANEL:   CBC Recent  Labs  Lab 11/19/19 0844  WBC 7.5  HGB 13.5  HCT 38.7  PLT 336   ------------------------------------------------------------------------------------------------------------------  Chemistries  Recent Labs  Lab 11/19/19 0844  NA 134*  K 3.7  CL 94*  CO2 28  GLUCOSE 110*  BUN 28*  CREATININE 0.72  CALCIUM 9.5   ------------------------------------------------------------------------------------------------------------------  Cardiac Enzymes No results for input(s): TROPONINI in the last 168 hours. ------------------------------------------------------------------------------------------------------------------  RADIOLOGY:  DG Chest 2 View  Result Date: 11/19/2019 CLINICAL DATA:  Shortness of breath EXAM: CHEST - 2 VIEW COMPARISON:  11/11/2019 FINDINGS: Small to moderate bilateral pleural effusions, increased on the right. Superimposed pleural air on the right, which is also present on the prior study though now more apical rather than basilar is. Bibasilar atelectasis. Normal heart size. No acute osseous abnormality. IMPRESSION: Moderate right hydropneumothorax. Similar small to moderate left pleural effusion. Associated bilateral atelectasis. Electronically Signed   By: Macy Mis M.D.   On: 11/19/2019 09:17    EKG:    IMPRESSION AND PLAN:   Savannah Galvan  is a 86 y.o. female with a known history of status post bilateral mastectomy in 2008 four history of right breast cancer. Patient has not received any chemotherapy. She has been on tamoxifen for five years. Hypothyroidism, hypertension, hyperlipidemia,, CKD stage II, dementia,  right upper extremity DVT on eliquis  Recurrent right pleural effusion suspected due to metastatic breast cancer moderate Hydro pneumothorax on chest x-ray -admit to medical floor -- observation -chest x-ray in the morning -hold eliquis -cardiothoracic consultation with Dr. Genevive Bi-- possible pleural X catheter placement after 48 hours off of  eliquis -IR-Dr. Reesa Chew aware of patient. Recommends at present to repeat x-ray in the morning to see if any change in lung volume.  Generalized weakness debility and possible fall -PT see patient -TOC for discharge planning  Hyponatremia mild -suspect due to hydrochlorothiazide-- hold for now  Right upper extremity DVT on eliquis -holding eliquis for possible procedure  Breast cancer with milligrams pleural effusion requiring repeated thoracentesis -patient follows with Dr. Janese Banks -patient also follows with Dr. Donella Stade-- radiation oncology and supposed to start treatment August 26 to right pectoralis mass   Family Communication : spoke with niece Megan--she is her primary contact Consults : cardiothoracic and IR Code Status : DNR prior to admission DVT prophylaxis :SCD, eliquis on hold admit status: observation  TOTAL TIME TAKING CARE OF THIS PATIENT: *45* minutes.    Fritzi Mandes M.D  [facility]     CC: Primary care physician; Clide Deutscher, [place]

## 2019-11-19 NOTE — ED Provider Notes (Signed)
[facility] Emergency Department Provider Note   ____________________________________________    I have reviewed the triage vital signs and the nursing notes.   HISTORY  Chief Complaint Shortness of Breath  History limited by dementia.   HPI Savannah Galvan is a 86 y.o. female who presents with complaints of shortness of breath.  Apparently patient has been complaining of shortness of breath especially with exertion over the last several days.  Patient denies cough fevers or chills although history limited   Past Medical History:  Diagnosis Date  . Breast cancer ([place])   . Chronic kidney disease (CKD), stage II (mild)   . Cyst of pancreas   . Hyperlipidemia, unspecified   . Hypertension   . Hypothyroidism, unspecified   . Nonrheumatic mitral (valve) insufficiency   . Obstructive sleep apnea (adult) (pediatric)     Patient Active Problem List   Diagnosis Date Noted  . Goals of care, counseling/discussion 11/06/2019  . Metastatic breast cancer ([place]) 11/06/2019    History reviewed. No pertinent surgical history.  Prior to Admission medications   Medication Sig Start Date End Date Taking? Authorizing Provider  atorvastatin (LIPITOR) 10 MG tablet Take 10 mg by mouth daily. Patient not taking: Reported on 11/17/2019 09/02/19   [provider]  ELIQUIS 5 MG TABS tablet Take by mouth. 09/23/19   [provider]  ezetimibe (ZETIA) 10 MG tablet Take 10 mg by mouth daily. Patient not taking: Reported on 11/17/2019 09/18/19   [provider]  hydrochlorothiazide (HYDRODIURIL) 25 MG tablet Take 25 mg by mouth daily. 09/27/19   [provider]  Javier Docker Oil 500 MG CAPS Take 500 mg by mouth daily. Patient not taking: Reported on 11/17/2019 07/21/19   Kathryne Hitch, RN  letrozole [facility]) 2.5 MG tablet Take 1 tablet (2.5 mg total) by mouth daily. Patient not taking: Reported on 11/17/2019 11/06/19   Sindy Guadeloupe, MD    losartan (COZAAR) 50 MG tablet Take 50 mg by mouth 2 (two) times daily. 09/30/19   [provider]  Multiple Vitamin (MULTIVITAMIN) tablet Take 1 tablet by mouth daily. Patient not taking: Reported on 11/17/2019 07/21/19   Kathryne Hitch, RN  mupirocin ointment (BACTROBAN) 2 % Apply 1 application topically 3 (three) times daily. Patient not taking: Reported on 11/17/2019 08/26/19   [provider]  MYRBETRIQ 50 MG TB24 tablet Take 50 mg by mouth daily. 09/22/19   [provider]  Nutritional Supplements (NUTRITIONAL SHAKE PLUS PROTEIN PO) Take by mouth daily.    Kathryne Hitch, RN  vitamin B-12 (CYANOCOBALAMIN) 1000 MCG tablet Take 1,000 mcg by mouth daily. 07/21/19   Kathryne Hitch, RN     Allergies Demerol [meperidine]  No family history on file.  Social History Social History   Tobacco Use  . Smoking status: Never Smoker  . Smokeless tobacco: Never Used  Vaping Use  . Vaping Use: Never used  Substance Use Topics  . Alcohol use: Yes    Comment: at supper when she wants it  . Drug use: Never   Limited by altered mental status Review of Systems  Constitutional: Denies fever Eyes: No visual changes.  ENT: Denies sore throat Cardiovascular: Denies chest pain. Respiratory: As above Gastrointestinal: No abdominal pain Genitourinary: Negative for dysuria. Musculoskeletal: Right shoulder pain Skin: Negative for rash. Neurological: No deficits   ____________________________________________   PHYSICAL EXAM:  VITAL SIGNS: ED Triage Vitals  Enc Vitals Group     BP  11/19/19 0841 98/60     Pulse Rate 11/19/19 0841 81     Resp 11/19/19 0841 18     Temp 11/19/19 0841 98.1 F (36.7 C)     Temp Source 11/19/19 0841 Oral     SpO2 11/19/19 0841 95 %     Weight 11/19/19 0842 51 kg (112 lb 7 oz)     Height 11/19/19 0842 1.676 m (5\' 6" )     Head Circumference --      Peak Flow --      Pain Score 11/19/19 0842 0     Pain Loc --      Pain Edu?  --      Excl. in [place]? --     Constitutional: Alert but disoriented Eyes: Conjunctivae are normal.  Head: Atraumatic. Nose: No congestion/rhinnorhea.    Cardiovascular: Normal rate, regular rhythm.  Good peripheral circulation. Respiratory: Normal respiratory effort.  No retractions.  Decreased breath sounds right base, no crepitus Gastrointestinal: Soft and nontender. No distention.  No CVA tenderness.  Musculoskeletal: No lower extremity tenderness nor edema.  Warm and well perfused Neurologic:  Normal speech and language. No gross focal neurologic deficits are appreciated.  Skin:  Skin is warm, dry and intact. No rash noted. Psychiatric: Mood and affect are normal. Speech and behavior are normal.  ____________________________________________   LABS (all labs ordered are listed, but only abnormal results are displayed)  Labs Reviewed  BASIC METABOLIC PANEL - Abnormal; Notable for the following components:      Result Value   Sodium 134 (*)    Chloride 94 (*)    Glucose, Bld 110 (*)    BUN 28 (*)    All other components within normal limits  SARS CORONAVIRUS 2 BY RT PCR (HOSPITAL ORDER, [place] [facility])  CBC  SURGICAL PATHOLOGY   ____________________________________________  EKG  ED ECG REPORT I, Lavonia Drafts, the attending physician, personally viewed and interpreted this ECG.  Date: 11/19/2019  Rhythm: normal sinus rhythm QRS Axis: normal Intervals: normal ST/T Wave abnormalities: normal Narrative Interpretation: no evidence of acute ischemia  ____________________________________________  RADIOLOGY  Chest x-ray reviewed by me, hydropneumothorax on the right ____________________________________________   PROCEDURES  Procedure(s) performed: No  Procedures   Critical Care performed: yes ____________________________________________   INITIAL IMPRESSION / ASSESSMENT AND PLAN / ED COURSE  Pertinent labs & imaging results that  were available during my care of the patient were reviewed by me and considered in my medical decision making (see chart for details).  Patient presents with shortness of breath as described above.  Notified by radiology of hydropneumothorax prior to seeing the patient.  This is likely the cause of her shortness of breath.  Afebrile.  No Covid symptoms.  Discussed with Dr. Reesa Chew of IR who recommended consultation with Dr. Genevive Bi  Discussed with Dr. Genevive Bi of CT surgery who recommends admission to the hospital, IR drain to see if lung expands  Have consulted hospitalist service   Attempted to contact patient's sister but went straight to voicemail    ____________________________________________   FINAL CLINICAL IMPRESSION(S) / ED DIAGNOSES  Final diagnoses:  Hydropneumothorax        Note:  This document was prepared using Dragon voice recognition software and may include unintentional dictation errors.   Lavonia Drafts, MD 11/19/19 1520

## 2019-11-19 NOTE — ED Notes (Signed)
Pt given meal tray. Coughs while eating. MD notified.

## 2019-11-19 NOTE — Progress Notes (Signed)
Report called to [facility] on 2c.

## 2019-11-19 NOTE — ED Notes (Signed)
Attempted to update pt's sister but she did not answer.

## 2019-11-19 NOTE — ED Triage Notes (Signed)
From [facility] via [employer]. Exertional SOB for a few days. Denies pain.  Pt alert and oriented X4, cooperative, RR even and unlabored, color WNL. Pt in NAD.

## 2019-11-19 NOTE — ED Notes (Signed)
Pt provided with phone to call sister. Pt st "my sister did not answered. I left a Vm".  Pt provided with ice pack and placed on right shoulder.

## 2019-11-20 ENCOUNTER — Observation Stay: Payer: Medicare Other

## 2019-11-20 ENCOUNTER — Ambulatory Visit: Payer: Federal, State, Local not specified - PPO

## 2019-11-20 DIAGNOSIS — Z79899 Other long term (current) drug therapy: Secondary | ICD-10-CM | POA: Diagnosis not present

## 2019-11-20 DIAGNOSIS — Z853 Personal history of malignant neoplasm of breast: Secondary | ICD-10-CM | POA: Diagnosis not present

## 2019-11-20 DIAGNOSIS — R5381 Other malaise: Secondary | ICD-10-CM | POA: Diagnosis present

## 2019-11-20 DIAGNOSIS — C778 Secondary and unspecified malignant neoplasm of lymph nodes of multiple regions: Secondary | ICD-10-CM | POA: Diagnosis not present

## 2019-11-20 DIAGNOSIS — Z170 Estrogen receptor positive status [ER+]: Secondary | ICD-10-CM | POA: Diagnosis not present

## 2019-11-20 DIAGNOSIS — F0390 Unspecified dementia without behavioral disturbance: Secondary | ICD-10-CM | POA: Diagnosis present

## 2019-11-20 DIAGNOSIS — Z20822 Contact with and (suspected) exposure to covid-19: Secondary | ICD-10-CM | POA: Diagnosis present

## 2019-11-20 DIAGNOSIS — Z79810 Long term (current) use of selective estrogen receptor modulators (SERMs): Secondary | ICD-10-CM | POA: Diagnosis not present

## 2019-11-20 DIAGNOSIS — Z885 Allergy status to narcotic agent status: Secondary | ICD-10-CM | POA: Diagnosis not present

## 2019-11-20 DIAGNOSIS — Z66 Do not resuscitate: Secondary | ICD-10-CM | POA: Diagnosis present

## 2019-11-20 DIAGNOSIS — G4733 Obstructive sleep apnea (adult) (pediatric): Secondary | ICD-10-CM | POA: Diagnosis present

## 2019-11-20 DIAGNOSIS — R0602 Shortness of breath: Secondary | ICD-10-CM | POA: Diagnosis present

## 2019-11-20 DIAGNOSIS — C50919 Malignant neoplasm of unspecified site of unspecified female breast: Secondary | ICD-10-CM | POA: Diagnosis not present

## 2019-11-20 DIAGNOSIS — J948 Other specified pleural conditions: Secondary | ICD-10-CM

## 2019-11-20 DIAGNOSIS — Z7901 Long term (current) use of anticoagulants: Secondary | ICD-10-CM | POA: Diagnosis not present

## 2019-11-20 DIAGNOSIS — E785 Hyperlipidemia, unspecified: Secondary | ICD-10-CM | POA: Diagnosis present

## 2019-11-20 DIAGNOSIS — I82621 Acute embolism and thrombosis of deep veins of right upper extremity: Secondary | ICD-10-CM | POA: Diagnosis present

## 2019-11-20 DIAGNOSIS — J9811 Atelectasis: Secondary | ICD-10-CM | POA: Diagnosis present

## 2019-11-20 DIAGNOSIS — Z9013 Acquired absence of bilateral breasts and nipples: Secondary | ICD-10-CM | POA: Diagnosis not present

## 2019-11-20 DIAGNOSIS — I340 Nonrheumatic mitral (valve) insufficiency: Secondary | ICD-10-CM | POA: Diagnosis present

## 2019-11-20 DIAGNOSIS — C7989 Secondary malignant neoplasm of other specified sites: Secondary | ICD-10-CM | POA: Diagnosis present

## 2019-11-20 DIAGNOSIS — E871 Hypo-osmolality and hyponatremia: Secondary | ICD-10-CM | POA: Diagnosis present

## 2019-11-20 DIAGNOSIS — Z86718 Personal history of other venous thrombosis and embolism: Secondary | ICD-10-CM | POA: Diagnosis not present

## 2019-11-20 DIAGNOSIS — J910 Malignant pleural effusion: Secondary | ICD-10-CM

## 2019-11-20 DIAGNOSIS — J939 Pneumothorax, unspecified: Secondary | ICD-10-CM | POA: Diagnosis present

## 2019-11-20 DIAGNOSIS — N182 Chronic kidney disease, stage 2 (mild): Secondary | ICD-10-CM | POA: Diagnosis present

## 2019-11-20 DIAGNOSIS — E039 Hypothyroidism, unspecified: Secondary | ICD-10-CM | POA: Diagnosis present

## 2019-11-20 DIAGNOSIS — I129 Hypertensive chronic kidney disease with stage 1 through stage 4 chronic kidney disease, or unspecified chronic kidney disease: Secondary | ICD-10-CM | POA: Diagnosis present

## 2019-11-20 LAB — PROTIME-INR
INR: 1 (ref 0.8–1.2)
Prothrombin Time: 12.4 seconds (ref 11.4–15.2)

## 2019-11-20 MED ORDER — CEFAZOLIN SODIUM-DEXTROSE 2-4 GM/100ML-% IV SOLN
2.0000 g | INTRAVENOUS | Status: AC
  Administered 2019-11-24: 2 g via INTRAVENOUS
  Filled 2019-11-20: qty 100

## 2019-11-20 MED ORDER — ENSURE ENLIVE PO LIQD
237.0000 mL | Freq: Two times a day (BID) | ORAL | Status: DC
  Administered 2019-11-22 – 2019-11-25 (×7): 237 mL via ORAL

## 2019-11-20 NOTE — TOC Initial Note (Addendum)
Transition of Care [facility]) - Initial/Assessment Note    Patient Details  Name: Savannah Galvan MRN: [id number] Date of Birth: [birth year]  Transition of Care [facility]) CM/SW Contact:    Candie Chroman, LCSW Phone Number: 11/20/2019, 9:21 AM  Clinical Narrative: Patient is a resident at [facility]. CSW called and spoke with staff member. She confirmed patient is assisted living level of care although patient is fairly independent. They offer her assistance but she usually prefers to do things on her own. Patient has a cane that she uses at the facility. She was not getting home health services prior to admission. No further concerns. CSW will continue to follow patient for support and facilitate discharge back to [facility] once medically stable.       2:25 pm: CSW spoke to Elisha Headland, Therapist, sports at [employer]. She said that they are unable to manage a pleurex drain at their facility. CSW discussed with cardiothoracic surgeon who stated that patient's niece is a hospice nurse and would be willing to manage the drain. CSW left message for Ms. Nicole Kindred to see if this would be possible.            Expected Discharge Plan: Assisted Living Barriers to Discharge: Continued Medical Work up   Patient Goals and CMS Choice        Expected Discharge Plan and Services Expected Discharge Plan: Assisted Living       Living arrangements for the past 2 months: Assisted Living Facility                                      Prior Living Arrangements/Services Living arrangements for the past 2 months: [place] Lives with:: Facility Resident Patient language and need for interpreter reviewed:: Yes Do you feel safe going back to the place where you live?: Yes      Need for Family Participation in Patient Care: Yes (Comment) Care giver support system in place?: Yes (comment) Current home services: DME Criminal Activity/Legal Involvement Pertinent to Current  Situation/Hospitalization: No - Comment as needed  Activities of Daily Living Home Assistive Devices/Equipment: Eyeglasses, Cane (specify quad or straight) ADL Screening (condition at time of admission) Patient's cognitive ability adequate to safely complete daily activities?: Yes Is the patient deaf or have difficulty hearing?: No Does the patient have difficulty seeing, even when wearing glasses/contacts?: No Does the patient have difficulty concentrating, remembering, or making decisions?: Yes Patient able to express need for assistance with ADLs?: Yes Does the patient have difficulty dressing or bathing?: No Independently performs ADLs?: Yes (appropriate for developmental age) Does the patient have difficulty walking or climbing stairs?: No Weakness of Legs: None Weakness of Arms/Hands: None  Permission Sought/Granted         Permission granted to share info w AGENCY: [facility]        Emotional Assessment Appearance:: Appears stated age     Orientation: : Oriented to Self, Oriented to Place Alcohol / Substance Use: Not Applicable Psych Involvement: No (comment)  Admission diagnosis:  Hydropneumothorax [J94.8] SOB (shortness of breath) [R06.02] Pleural effusion on left [J90] Pneumothorax [J93.9] Patient Active Problem List   Diagnosis Date Noted  . Hydropneumothorax 11/19/2019  . Hyponatremia   . Goals of care, counseling/discussion 11/06/2019  . Metastatic breast cancer ([place]) 11/06/2019   PCP:  Clide Deutscher, [place] Pharmacy:  No Pharmacies Listed    Social  Determinants of Health (SDOH) Interventions    Readmission Risk Interventions No flowsheet data found.

## 2019-11-20 NOTE — Evaluation (Signed)
Clinical/Bedside Swallow Evaluation Patient Details  Name: Savannah Galvan MRN: [id number] Date of Birth: [birth year]  Today's Date: 11/20/2019 Time: SLP Start Time (ACUTE ONLY): 49 SLP Stop Time (ACUTE ONLY): 1215 SLP Time Calculation (min) (ACUTE ONLY): 45 min  Past Medical History:  Past Medical History:  Diagnosis Date  . Breast cancer ([place])   . Chronic kidney disease (CKD), stage II (mild)   . Cyst of pancreas   . Hyperlipidemia, unspecified   . Hypertension   . Hypothyroidism, unspecified   . Nonrheumatic mitral (valve) insufficiency   . Obstructive sleep apnea (adult) (pediatric)    Past Surgical History: History reviewed. No pertinent surgical history. HPI:  Pt is a 86 y.o. female with a known history of status post bilateral mastectomy in 2008 four history of right breast cancer. Patient has not received any chemotherapy. She has been on tamoxifen for five years. Hypothyroidism, hypertension, hyperlipidemia, CKD stage II, dementia, right upper extremity DVT on eliquis.  Patient recently underwent  Right sided thoracentesis on July 16 and August 17 by IR. Cytology consistent with metastatic breast cancer. Patient follows with radiation oncology Dr. Donella Stade who is supposed to start radiation therapy from August 26.  Pt was admitted with shortness of breath this admission.  CXR showing right hydropneumothorax which was essentially unchanged over the last few weeks.  Pt has had 2 thoracentesis with malignant cells in right pleural fluid.    Assessment / Plan / Recommendation Clinical Impression  This was a limited exam d/t pt's NPO status today for potential procedure per MD who arrived in room during session. With few trials of thin liquids and ice chips given, an initial assessment was completed w/ intention to f/u tomorrow w/ toleration of recommended (by SLP) oral diet as it initiates post procedure (per NSG activation). Pt appears to present w/ adequate oropharyngeal phase  swallow w/ No oropharyngeal phase dysphagia noted, No neuromuscular deficits noted.Pt consumed po trials w/ No overt, clinical s/s of aspiration during po trials. Pt appears at reduced risk for aspiration following general aspiration precautions. During po trials, pt consumed all trials w/ no overt coughing, decline in vocal quality, or change in respiratory presentation during/post trials. Oral phase appeared [facility] w/ timely bolus management, mastication, and control of bolus propulsion for A-P transfer for swallowing. Oral clearing achieved w/ all trial consistencies. OM Exam appeared [facility] w/ no unilateral weakness noted. Speech Clear. Native Dentition. Pt fed self w/ setup support. Recommend a more Mech Soft consistency diet w/ well-Cut meats, moistened foods; Thin liquidsVIA CUP - for pt's best control. Recommend general aspiration precautions, Pills WHOLE in Puree for safer, easier swallowing as pt described Larger pills were difficult to swallow "sometimes". Education given on Pills in Puree; food consistencies and easy to eat options and foods she likes; general aspiration precautions. Pt would benefit from Tray setup and monitoring during meals d/t Baseline Dementia/cognitive decline. NSG agreed.  SLP Visit Diagnosis: Dysphagia, unspecified (R13.10)    Aspiration Risk  Mild aspiration risk;Risk for inadequate nutrition/hydration (but reduced following general aspiration precs)    Diet Recommendation  Mech Soft diet w/ meats Cut well, Gravies added to moisten; Thin liquids Via Cup recommended. General aspiration precautions and support at meals as needed. Foods of preference to increase desire for oral intake overall  Medication Administration: Whole meds with puree (as needed for easier, safer swallowing)    Other  Recommendations Recommended Consults:  (Dietician f/u) Oral Care Recommendations: Oral care BID;Oral care before and after PO;Staff/trained  caregiver to provide oral care Other  Recommendations:  (n/a)   Follow up Recommendations None      Frequency and Duration min 1 x/week  1 week       Prognosis Prognosis for Safe Diet Advancement: Good Barriers to Reach Goals: Cognitive deficits;Time post onset;Severity of deficits (Lack of Appetite (per pt))      Swallow Study   General Date of Onset: 11/19/19 HPI: Pt is a 86 y.o. female with a known history of status post bilateral mastectomy in 2008 four history of right breast cancer. Patient has not received any chemotherapy. She has been on tamoxifen for five years. Hypothyroidism, hypertension, hyperlipidemia, CKD stage II, dementia, right upper extremity DVT on eliquis.  Patient recently underwent  Right sided thoracentesis on July 16 and August 17 by IR. Cytology consistent with metastatic breast cancer. Patient follows with radiation oncology Dr. Donella Stade who is supposed to start radiation therapy from August 26.  Pt was admitted with shortness of breath this admission.  CXR showing right hydropneumothorax which was essentially unchanged over the last few weeks.  Pt has had 2 thoracentesis with malignant cells in right pleural fluid.  Type of Study: Bedside Swallow Evaluation Previous Swallow Assessment: none Diet Prior to this Study: NPO (pending procedure) Temperature Spikes Noted: No (wbc 7.5) Respiratory Status: Room air (no c/o SOB) History of Recent Intubation: No Behavior/Cognition: Alert;Cooperative;Pleasant mood;Distractible;Requires cueing (baseline Dementia) Oral Cavity Assessment: Within Functional Limits;Dry (min) Oral Care Completed by SLP: Yes Oral Cavity - Dentition: Adequate natural dentition Vision: Functional for self-feeding Self-Feeding Abilities: Able to feed self;Needs set up;Needs assist Patient Positioning: Upright in bed (needed min positioning support more forward) Baseline Vocal Quality: Normal Volitional Cough: Strong Volitional Swallow: Able to elicit    Oral/Motor/Sensory  Function Overall Oral Motor/Sensory Function: Within functional limits   Ice Chips Ice chips: Within functional limits Presentation: Spoon (fed; 3 trials) Other Comments: even masticated intermittently   Thin Liquid Thin Liquid: Within functional limits Presentation: Cup;Self Fed (5 trials)    Nectar Thick Nectar Thick Liquid: Not tested   Honey Thick Honey Thick Liquid: Not tested   Puree Puree: Not tested   Solid     Solid: Not tested       Orinda Kenner, MS, CCC-SLP Mathius Birkeland 11/20/2019,2:36 PM

## 2019-11-20 NOTE — Progress Notes (Signed)
On admission, bandaide noted on patient's right anterior shoulder with old, dry drainage; removed bandaide and found skin nodules that had bled and stitches; patient was unaware of what had happened or why there were stitches; foam dressing applied to dry wound. Wound measures 5 cm X 2.5 cm and raised from surface of skin. Stitches appear to be on lateral, lower edge of nodules. Barbaraann Faster, RN

## 2019-11-20 NOTE — Consult Note (Signed)
Hematology/Oncology Consult note [facility] [phone] [phone]  Patient Care Team: Clide Deutscher, [place] as PCP - General (General Practice)   Name of the patient: Savannah Galvan  [id number]  1929/07/27    Reason for consult: metastatic breast cancer   Requesting physician: DR. Fritzi Mandes  Date of visit: 11/20/2019    History of presenting illness- Patient is a 86 year old female who was seen by me as an outpatient and was recently diagnosed with metastatic ER positive breast cancer.  PET scan showed confluent soft tissue thickening involving the right pectoralis muscle with overlying skin nodularity.  Small mediastinal lymph nodes which showed marked hypermetabolism and bilateral pleural effusions.  She has had 2 right-sided thoracentesis so far which was consistent with ER/PR positive and HER-2 negative breast cancer.  She recently underwent her second thoracentesis on 11/11/2019 and also underwent skin biopsy by Dr. Bary Castilla for additional tissue for NGS testing.  She presented to the ER with increasing shortness of breath and underwent chest x-ray which showed moderate right hydropneumothorax and small to moderate left pleural effusion.  Dr. Genevive Bi has been consulted and plan is for Pleurx catheter tomorrow.  She also has a history of right upper extremity DVT for which she is on Eliquis which is currently on hold prior to procedure.  She was started on letrozole as an outpatient on 11/06/2019 which is being continued.  Plan is to hold off on starting any CDK inhibitors until she completes palliative radiation to the right chest wall mass  ECOG PS- 1-2  Pain scale- 0   Review of systems- Review of Systems  Constitutional: Positive for malaise/fatigue.  Respiratory: Positive for shortness of breath.     Allergies  Allergen Reactions  . Demerol [Meperidine] Nausea And Vomiting    Patient Active Problem List   Diagnosis Date Noted  .  Hydropneumothorax 11/19/2019  . Hyponatremia   . Goals of care, counseling/discussion 11/06/2019  . Metastatic breast cancer ([place]) 11/06/2019     Past Medical History:  Diagnosis Date  . Breast cancer ([place])   . Chronic kidney disease (CKD), stage II (mild)   . Cyst of pancreas   . Hyperlipidemia, unspecified   . Hypertension   . Hypothyroidism, unspecified   . Nonrheumatic mitral (valve) insufficiency   . Obstructive sleep apnea (adult) (pediatric)      History reviewed. No pertinent surgical history.  Social History   Socioeconomic History  . Marital status: Widowed    Spouse name: Not on file  . Number of children: Not on file  . Years of education: Not on file  . Highest education level: Not on file  Occupational History  . Not on file  Tobacco Use  . Smoking status: Never Smoker  . Smokeless tobacco: Never Used  Vaping Use  . Vaping Use: Never used  Substance and Sexual Activity  . Alcohol use: Yes    Comment: at supper when she wants it  . Drug use: Never  . Sexual activity: Not Currently  Other Topics Concern  . Not on file  Social History Narrative  . Not on file   Social Determinants of Health   Financial Resource Strain:   . Difficulty of Paying Living Expenses: Not on file  Food Insecurity:   . Worried About Charity fundraiser in the Last Year: Not on file  . Ran Out of Food in the Last Year: Not on file  Transportation Needs:   . Lack of Transportation (  Medical): Not on file  . Lack of Transportation (Non-Medical): Not on file  Physical Activity:   . Days of Exercise per Week: Not on file  . Minutes of Exercise per Session: Not on file  Stress:   . Feeling of Stress : Not on file  Social Connections:   . Frequency of Communication with Friends and Family: Not on file  . Frequency of Social Gatherings with Friends and Family: Not on file  . Attends Religious Services: Not on file  . Active Member of Clubs or Organizations: Not on file  .  Attends Archivist Meetings: Not on file  . Marital Status: Not on file  Intimate Partner Violence:   . Fear of Current or Ex-Partner: Not on file  . Emotionally Abused: Not on file  . Physically Abused: Not on file  . Sexually Abused: Not on file     History reviewed. No pertinent family history.   Current Facility-Administered Medications:  .  0.9 %  sodium chloride infusion, 250 mL, Intravenous, PRN, Fritzi Mandes, MD .  acetaminophen (TYLENOL) tablet 650 mg, 650 mg, Oral, Q6H PRN **OR** acetaminophen (TYLENOL) suppository 650 mg, 650 mg, Rectal, Q6H PRN, Fritzi Mandes, MD .  albuterol (PROVENTIL) (2.5 MG/3ML) 0.083% nebulizer solution 2.5 mg, 2.5 mg, Nebulization, Q2H PRN, Fritzi Mandes, MD .  atorvastatin (LIPITOR) tablet 10 mg, 10 mg, Oral, Daily, Fritzi Mandes, MD, 10 mg at 11/19/19 1717 .  ceFAZolin (ANCEF) IVPB 2g/100 mL premix, 2 g, Intravenous, 30 min Pre-Op, [place], Christia Reading, MD .  ezetimibe (ZETIA) tablet 10 mg, 10 mg, Oral, Daily, Fritzi Mandes, MD, 10 mg at 11/20/19 0917 .  feeding supplement (ENSURE ENLIVE) (ENSURE ENLIVE) liquid 237 mL, 237 mL, Oral, BID BM, Sharen Hones, MD .  letrozole [facility]) tablet 2.5 mg, 2.5 mg, Oral, Daily, Fritzi Mandes, MD, 2.5 mg at 11/20/19 0916 .  losartan (COZAAR) tablet 50 mg, 50 mg, Oral, BID, Fritzi Mandes, MD, 50 mg at 11/20/19 2036 .  mirabegron ER (MYRBETRIQ) tablet 50 mg, 50 mg, Oral, Daily, Fritzi Mandes, MD, 50 mg at 11/20/19 0917 .  mirtazapine (REMERON) tablet 7.5 mg, 7.5 mg, Oral, QHS, Fritzi Mandes, MD, 7.5 mg at 11/20/19 2036 .  multivitamin with minerals tablet, , Oral, Daily, Fritzi Mandes, MD, 1 tablet at 11/19/19 1718 .  ondansetron (ZOFRAN) tablet 4 mg, 4 mg, Oral, Q6H PRN **OR** ondansetron (ZOFRAN) injection 4 mg, 4 mg, Intravenous, Q6H PRN, Fritzi Mandes, MD .  polyethylene glycol (MIRALAX / GLYCOLAX) packet 17 g, 17 g, Oral, Daily PRN, Fritzi Mandes, MD .  senna (SENOKOT) tablet 8.6 mg, 1 tablet, Oral, BID, Fritzi Mandes, MD, 8.6 mg at  11/20/19 2035 .  sodium chloride flush (NS) 0.9 % injection 3 mL, 3 mL, Intravenous, Q12H, Fritzi Mandes, MD, 3 mL at 11/20/19 2036 .  sodium chloride flush (NS) 0.9 % injection 3 mL, 3 mL, Intravenous, PRN, Fritzi Mandes, MD   Physical exam:  Vitals:   11/19/19 2055 11/20/19 0527 11/20/19 1507 11/20/19 1957  BP: (!) 143/70 122/61 118/66 (!) 103/54  Pulse: 71 65 71 80  Resp: _0 Temp: 98 F (36.7 C) 97.7 F (36.5 C) 98.2 F (36.8 C) 98.9 F (37.2 C)  TempSrc: Oral Oral Oral Oral  SpO2: 97% 93% 91% 98%  Weight: 119 lb 14.9 oz (54.4 kg)     Height: 5' 6" (1.676 m)      Physical Exam Constitutional:      Comments: Patient appears fatigued  and is on 2 L of oxygen  Cardiovascular:     Rate and Rhythm: Normal rate and regular rhythm.     Heart sounds: Normal heart sounds.  Pulmonary:     Comments: Breath sounds decreased bilateral lower lung bases Abdominal:     General: Bowel sounds are normal.     Palpations: Abdomen is soft.  Skin:    General: Skin is warm and dry.  Neurological:     Mental Status: She is alert and oriented to person, place, and time.        CMP Latest Ref Rng & Units 11/19/2019  Glucose 70 - 99 mg/dL 110(H)  BUN 8 - 23 mg/dL 28(H)  Creatinine 0.44 - 1.00 mg/dL 0.72  Sodium 135 - 145 mmol/L 134(L)  Potassium 3.5 - 5.1 mmol/L 3.7  Chloride 98 - 111 mmol/L 94(L)  CO2 22 - 32 mmol/L 28  Calcium 8.9 - 10.3 mg/dL 9.5  Total Protein 6.5 - 8.1 g/dL -  Total Bilirubin 0.3 - 1.2 mg/dL -  Alkaline Phos 38 - 126 U/L -  AST 15 - 41 U/L -  ALT 0 - 44 U/L -   CBC Latest Ref Rng & Units 11/19/2019  WBC 4.0 - 10.5 K/uL 7.5  Hemoglobin 12.0 - 15.0 g/dL 13.5  Hematocrit 36 - 46 % 38.7  Platelets 150 - 400 K/uL 336    '@IMAGES'$ @  DG Chest 1 View  Result Date: 11/11/2019 CLINICAL DATA:  Recurrent malignant right pleural effusion related to metastatic breast carcinoma. Status post right thoracentesis. EXAM: CHEST  1 VIEW COMPARISON:  10/24/2019 FINDINGS:  Stable heart size. Similar to the prior study, there is lack of complete re-expansion of the right lung after thoracentesis, especially along the lateral basilar aspect of the hemithorax. Small amount of residual pleural fluid remains. There is a small to moderate left pleural effusion. No pulmonary edema. IMPRESSION: 1. Similar lack of complete re-expansion of the right lung after thoracentesis. Small amount of residual pleural fluid remains. 2. Small to moderate left pleural effusion. Electronically Signed   By: Aletta Edouard M.D.   On: 11/11/2019 15:27   DG Chest 1 View  Result Date: 10/24/2019 CLINICAL DATA:  Bilateral pleural effusions, right greater than left and history breast carcinoma with evidence by recent PET scan of recurrent disease. EXAM: ULTRASOUND GUIDED RIGHT THORACENTESIS COMPARISON:  None. PROCEDURE: An ultrasound guided thoracentesis was thoroughly discussed with the patient and questions answered. The benefits, risks, alternatives and complications were also discussed. The patient understands and wishes to proceed with the procedure. Written consent was obtained. Ultrasound was performed to localize and mark an adequate pocket of fluid in the right chest. The area was then prepped and draped in the normal sterile fashion. 1% Lidocaine was used for local anesthesia. Under ultrasound guidance a 6 French Safe-T-Centesis catheter was introduced. Thoracentesis was performed. The catheter was removed and a dressing applied. COMPLICATIONS: None FINDINGS: A total of approximately 1.5 L of clear, yellow fluid was removed. A fluid sample was sent for laboratory analysis. IMPRESSION: Successful ultrasound guided right thoracentesis yielding 1.5 L of pleural fluid. Electronically Signed   By: Aletta Edouard M.D.   On: 10/24/2019 16:25   DG Chest 2 View  Result Date: 11/19/2019 CLINICAL DATA:  Shortness of breath EXAM: CHEST - 2 VIEW COMPARISON:  11/11/2019 FINDINGS: Small to moderate bilateral  pleural effusions, increased on the right. Superimposed pleural air on the right, which is also present on the prior study though now  more apical rather than basilar is. Bibasilar atelectasis. Normal heart size. No acute osseous abnormality. IMPRESSION: Moderate right hydropneumothorax. Similar small to moderate left pleural effusion. Associated bilateral atelectasis. Electronically Signed   By: Guadlupe Spanish M.D.   On: 11/19/2019 09:17   DG Chest Port 1 View  Result Date: 11/20/2019 CLINICAL DATA:  Shortness of breath.  Chest pain EXAM: PORTABLE CHEST 1 VIEW COMPARISON:  11/19/2019. FINDINGS: Stable moderate right-sided pneumothorax. Heart size stable. Prominent bilateral pulmonary infiltrates/edema. Progressive right pleural effusion. Stable left pleural effusion. CHF could present in this fashion. Bilateral pneumonia cannot be excluded. Persistent bibasilar atelectasis. Surgical clips right chest. No acute bony abnormality. IMPRESSION: 1.  Stable moderate right-sided pneumothorax. 2. Prominent bilateral pulmonary infiltrates/edema. Progressive right pleural effusion. Stable left pleural effusion. CHF could present in this fashion. Bilateral pneumonia cannot be excluded. 3.  Persistent bibasilar atelectasis. Electronically Signed   By: Maisie Fus  Register   On: 11/20/2019 07:13   US THORACENTESIS ASP PLEURAL SPACE W/IMG GUIDE  Result Date: 11/11/2019 CLINICAL DATA:  Recurrent malignant right pleural effusion from metastatic breast carcinoma. EXAM: ULTRASOUND GUIDED RIGHT THORACENTESIS COMPARISON:  None. PROCEDURE: An ultrasound guided thoracentesis was thoroughly discussed with the patient and questions answered. The benefits, risks, alternatives and complications were also discussed. The patient understands and wishes to proceed with the procedure. Written consent was obtained. Ultrasound was performed to localize and mark an adequate pocket of fluid in the right chest. The area was then prepped and draped  in the normal sterile fashion. 1% Lidocaine was used for local anesthesia. Under ultrasound guidance a 19 gauge Yueh catheter was introduced. Thoracentesis was performed. The catheter was removed and a dressing applied. COMPLICATIONS: None FINDINGS: A total of approximately 1.9 L of bloody fluid was removed. A fluid sample wassent for cytologic analysis as requested. IMPRESSION: Successful ultrasound guided right thoracentesis yielding 1.9 L of pleural fluid. Electronically Signed   By: Irish Lack M.D.   On: 11/11/2019 15:25   US THORACENTESIS ASP PLEURAL SPACE W/IMG GUIDE  Result Date: 10/24/2019 CLINICAL DATA:  Bilateral pleural effusions, right greater than left and history breast carcinoma with evidence by recent PET scan of recurrent disease. EXAM: ULTRASOUND GUIDED RIGHT THORACENTESIS COMPARISON:  None. PROCEDURE: An ultrasound guided thoracentesis was thoroughly discussed with the patient and questions answered. The benefits, risks, alternatives and complications were also discussed. The patient understands and wishes to proceed with the procedure. Written consent was obtained. Ultrasound was performed to localize and mark an adequate pocket of fluid in the right chest. The area was then prepped and draped in the normal sterile fashion. 1% Lidocaine was used for local anesthesia. Under ultrasound guidance a 6 French Safe-T-Centesis catheter was introduced. Thoracentesis was performed. The catheter was removed and a dressing applied. COMPLICATIONS: None FINDINGS: A total of approximately 1.5 L of clear, yellow fluid was removed. A fluid sample was sent for laboratory analysis. IMPRESSION: Successful ultrasound guided right thoracentesis yielding 1.5 L of pleural fluid. Electronically Signed   By: Irish Lack M.D.   On: 10/24/2019 16:25    Assessment and plan- Patient is a 85 y.o. female with newly diagnosed metastatic ER/PR positive HER-2 negative breast cancer with malignant pleural effusion  mediastinal adenopathy and right pectoralis muscle chest wall mass with overlying skin nodularity  1.  Patient will continue letrozole as an inpatient as well as an outpatient when she starts radiation treatment.  We are holding off on CDK inhibitors at this time until she finishes radiation treatment.  2.  She has been having recurrent right pleural effusion which is proven to be malignant and will be undergoing Pleurx catheter placement tomorrow.  3.  Kindly resume Eliquis right after the Pleurx procedure as she has a history of recent right upper extremity DVT  Follow-up with me as an outpatient    Visit Diagnosis 1. Hydropneumothorax   2. SOB (shortness of breath)   3. Pneumothorax   4. Encounter for chest tube placement     Dr. Randa Evens, MD, MPH [facility] [phone] 11/20/2019 9:23 PM

## 2019-11-20 NOTE — Progress Notes (Signed)
Patient ID: Savannah Galvan, female   DOB: [birth year], 86 y.o.   MRN: [id number]  Chief Complaint  Patient presents with  . Shortness of Breath    Referred By Dr. Posey Pronto Reason for Referral Right recurrent pleural effusion  HPI Location, Quality, Duration, Severity, Timing, Context, Modifying Factors, Associated Signs and Symptoms.  Savannah Galvan is a 86 y.o. female.  Resident of [facility].  Admitted with shortness of breath.  Had CXRay showing right hydropneumothorax which was essentially unchanged over the last few weeks.  Has had 2 thoracentesis with malignant cells in right pleural fluid.  History of breast cancer with bilateral mastectomies.  Had a DVT right arm with an open wound over the anterior aspect of her humerus at the shoulder.  Asked to see for PleurX.  Patient has a niece with whom I spoke.  Patient has dementia though she seemed very interactive with me and did not complain of shortness of breath today.  Niece is a Merchandiser, retail and is health care POA.      Past Medical History:  Diagnosis Date  . Breast cancer ([place])   . Chronic kidney disease (CKD), stage II (mild)   . Cyst of pancreas   . Hyperlipidemia, unspecified   . Hypertension   . Hypothyroidism, unspecified   . Nonrheumatic mitral (valve) insufficiency   . Obstructive sleep apnea (adult) (pediatric)     History reviewed. No pertinent surgical history.  History reviewed. No pertinent family history.  Social History Social History   Tobacco Use  . Smoking status: Never Smoker  . Smokeless tobacco: Never Used  Vaping Use  . Vaping Use: Never used  Substance Use Topics  . Alcohol use: Yes    Comment: at supper when she wants it  . Drug use: Never    Allergies  Allergen Reactions  . Demerol [Meperidine] Nausea And Vomiting    Current Facility-Administered Medications  Medication Dose Route Frequency Provider Last Rate Last Admin  . 0.9 %  sodium chloride infusion  250 mL Intravenous PRN  Fritzi Mandes, MD      . acetaminophen (TYLENOL) tablet 650 mg  650 mg Oral Q6H PRN Fritzi Mandes, MD       Or  . acetaminophen (TYLENOL) suppository 650 mg  650 mg Rectal Q6H PRN Fritzi Mandes, MD      . albuterol (PROVENTIL) (2.5 MG/3ML) 0.083% nebulizer solution 2.5 mg  2.5 mg Nebulization Q2H PRN Fritzi Mandes, MD      . atorvastatin (LIPITOR) tablet 10 mg  10 mg Oral Daily Fritzi Mandes, MD   10 mg at 11/19/19 1717  . ceFAZolin (ANCEF) IVPB 2g/100 mL premix  2 g Intravenous 30 min Pre-Op Nestor Lewandowsky, MD      . ezetimibe (ZETIA) tablet 10 mg  10 mg Oral Daily Fritzi Mandes, MD   10 mg at 11/20/19 0917  . letrozole [facility]) tablet 2.5 mg  2.5 mg Oral Daily Fritzi Mandes, MD   2.5 mg at 11/20/19 0916  . losartan (COZAAR) tablet 50 mg  50 mg Oral BID Fritzi Mandes, MD   50 mg at 11/20/19 0916  . mirabegron ER (MYRBETRIQ) tablet 50 mg  50 mg Oral Daily Fritzi Mandes, MD   50 mg at 11/20/19 0917  . mirtazapine (REMERON) tablet 7.5 mg  7.5 mg Oral QHS Fritzi Mandes, MD   7.5 mg at 11/19/19 2156  . multivitamin with minerals tablet   Oral Daily Fritzi Mandes, MD   1 tablet at  11/19/19 1718  . ondansetron (ZOFRAN) tablet 4 mg  4 mg Oral Q6H PRN Fritzi Mandes, MD       Or  . ondansetron [facility]) injection 4 mg  4 mg Intravenous Q6H PRN Fritzi Mandes, MD      . polyethylene glycol (MIRALAX / GLYCOLAX) packet 17 g  17 g Oral Daily PRN Fritzi Mandes, MD      . senna (SENOKOT) tablet 8.6 mg  1 tablet Oral BID Fritzi Mandes, MD   8.6 mg at 11/19/19 2156  . sodium chloride flush (NS) 0.9 % injection 3 mL  3 mL Intravenous Q12H Fritzi Mandes, MD   3 mL at 11/20/19 1041  . sodium chloride flush (NS) 0.9 % injection 3 mL  3 mL Intravenous PRN Fritzi Mandes, MD          Review of Systems A complete review of systems was asked and was negative except for the following positive findings right arm DVT  Blood pressure 122/61, pulse 65, temperature 97.7 F (36.5 C), temperature source Oral, resp. rate 16, height 5\' 6"  (1.676 m), weight  54.4 kg, SpO2 93 %.  Physical Exam CONSTITUTIONAL:  Pleasant, well-developed, well-nourished, and in no acute distress. EYES: Pupils equal and reactive to light, Sclera non-icteric EARS, NOSE, MOUTH AND THROAT:  The oropharynx was clear.  Dentition is good repair.  Oral mucosa pink and moist. LYMPH NODES:  Lymph nodes in the neck and axillae were normal RESPIRATORY:  Lungs were diminished bilaterally.  Normal respiratory effort without pathologic use of accessory muscles of respiration CARDIOVASCULAR: Heart was regular without murmurs.  There were no carotid bruits. GI: The abdomen was soft, nontender, and nondistended. There were no palpable masses. There was no hepatosplenomegaly. There were normal bowel sounds in all quadrants. GU:  Rectal deferred.   MUSCULOSKELETAL:  Normal muscle strength and tone.  No clubbing or cyanosis.  Marked right upper extremity swelling SKIN:  There were no pathologic skin lesions.  There were no nodules on palpation. NEUROLOGIC:  Sensation is normal.  Cranial nerves are grossly intact. PSYCH:  Oriented to person, place and time.  Mood and affect are normal.  Data Reviewed CXRay  CT scan  I have personally reviewed the patient's imaging, laboratory findings and medical records.    Assessment    Recurrent right pleural effusion    Plan    Discussed with niece and patient.  Reviewed with Dr. Anselm Pancoast and Dr. Posey Pronto.  Niece is very familiar with Pleurx.  Risks reviewed - bleeding, infection, lung injury, pneumothorax, death.  Dr. Posey Pronto to discuss with Social Work management of Pleurx at [employer].  Plan for PleurX tomorrow.  Hold eliquis.       Nestor Lewandowsky, MD 11/20/2019, 1:39 PM   Patient ID: Savannah Galvan, female   DOB: 1929/06/23, 86 y.o.   MRN: [id number]

## 2019-11-20 NOTE — Progress Notes (Signed)
PROGRESS NOTE    Savannah Galvan  FXO:[id number] DOB: 10/13/1929 DOA: 11/19/2019 PCP: Clide Deutscher, PA   Chief complaint.  Shortness of breath.  Brief Narrative: Savannah Galvan  is a 86 y.o. female with a known history of status post bilateral mastectomy in 2008 four history of right breast cancer. Patient has not received any chemotherapy. She has been on tamoxifen for five years. Hypothyroidism, hypertension, hyperlipidemia,, CKD stage II, dementia, right upper extremity DVT on eliquis  Patient recently underwent  Right sided thoracentesis on July 16 and August 17 by IR. Cytology consistent with metastatic breast cancer. Patient follows with radiation oncology Dr. Donella Stade who is supposed to start radiation therapy from August 26.   Assessment & Plan:   Active Problems:   Hydropneumothorax  #1.  Recurrent right hydropneumothorax.  Secondary to metastatic breast cancer. Initially planned for chest tube placement, spoke with Dr. Genevive Bi, he will place Pleurx catheter instead.  Continue hold anticoagulation.  2.  Generalized weakness. Continue physical therapy.  3.  Hyponatremia. Discontinue hydrothiazide.  4.  History of DVT. Hold Eliquis for procedure.  5.  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  As above.       DVT prophylaxis: SCDs Code Status: DNR Family Communication:None Disposition Plan:  . Patient came from:             . Anticipated d/c place: ?  . Barriers to d/c OR conditions which need to be met to effect a safe d/c:   Consultants:   CT surgery  Procedures:None Antimicrobials: None  Subjective: Patient still complaining short of breath with exertion.  Cough, nonproductive. No chest pain. No fever or chills. No abdominal pain nausea vomiting.  Objective: Vitals:   11/19/19 1736 11/19/19 1900 11/19/19 2055 11/20/19 0527  BP: (!) 165/100 126/64 (!) 143/70 122/61  Pulse: 93 82 71 65  Resp: (!) '22 18 16 16  ' Temp:  98 F (36.7  C) 98 F (36.7 C) 97.7 F (36.5 C)  TempSrc:  Oral Oral Oral  SpO2: 96% 97% 97% 93%  Weight:   54.4 kg   Height:   '5\' 6"'  (1.676 m)    No intake or output data in the 24 hours ending 11/20/19 1430 Filed Weights   11/19/19 0842 11/19/19 2055  Weight: 51 kg 54.4 kg    Examination:  General exam: Appears calm and comfortable  Respiratory system: Significant decreased breathing sounds bilaterally. Respiratory effort normal. Cardiovascular system: S1 & S2 heard, RRR. No JVD, murmurs, rubs, gallops or clicks. No pedal edema. Gastrointestinal system: Abdomen is nondistended, soft and nontender. No organomegaly or masses felt. Normal bowel sounds heard. Central nervous system: Alert and oriented x2. No focal neurological deficits. Extremities: Symmetric  Skin: No rashes, Psychiatry: Mood & affect appropriate.     Data Reviewed: I have personally reviewed following labs and imaging studies  CBC: Recent Labs  Lab 11/19/19 0844  WBC 7.5  HGB 13.5  HCT 38.7  MCV 90.8  PLT 600   Basic Metabolic Panel: Recent Labs  Lab 11/19/19 0844  NA 134*  K 3.7  CL 94*  CO2 28  GLUCOSE 110*  BUN 28*  CREATININE 0.72  CALCIUM 9.5   GFR: Estimated Creatinine Clearance: 42.5 mL/min (by C-G formula based on SCr of 0.72 mg/dL). Liver Function Tests: No results for input(s): AST, ALT, ALKPHOS, BILITOT, PROT, ALBUMIN in the last 168 hours. No results for input(s): LIPASE, AMYLASE in the last 168 hours. No results for input(s): AMMONIA  in the last 168 hours. Coagulation Profile: Recent Labs  Lab 11/20/19 1303  INR 1.0   Cardiac Enzymes: No results for input(s): CKTOTAL, CKMB, CKMBINDEX, TROPONINI in the last 168 hours. BNP (last 3 results) No results for input(s): PROBNP in the last 8760 hours. HbA1C: No results for input(s): HGBA1C in the last 72 hours. CBG: No results for input(s): GLUCAP in the last 168 hours. Lipid Profile: No results for input(s): CHOL, HDL, LDLCALC, TRIG,  CHOLHDL, LDLDIRECT in the last 72 hours. Thyroid Function Tests: No results for input(s): TSH, T4TOTAL, FREET4, T3FREE, THYROIDAB in the last 72 hours. Anemia Panel: No results for input(s): VITAMINB12, FOLATE, FERRITIN, TIBC, IRON, RETICCTPCT in the last 72 hours. Sepsis Labs: No results for input(s): PROCALCITON, LATICACIDVEN in the last 168 hours.  Recent Results (from the past 240 hour(s))  SARS Coronavirus 2 by RT PCR (hospital order, performed in [facility] hospital lab) Nasopharyngeal Nasopharyngeal Swab     Status: None   Collection Time: 11/19/19  2:06 PM   Specimen: Nasopharyngeal Swab  Result Value Ref Range Status   SARS Coronavirus 2 NEGATIVE NEGATIVE Final    Comment: (NOTE) SARS-CoV-2 target nucleic acids are NOT DETECTED.  The SARS-CoV-2 RNA is generally detectable in upper and lower respiratory specimens during the acute phase of infection. The lowest concentration of SARS-CoV-2 viral copies this assay can detect is 250 copies / mL. A negative result does not preclude SARS-CoV-2 infection and should not be used as the sole basis for treatment or other patient management decisions.  A negative result may occur with improper specimen collection / handling, submission of specimen other than nasopharyngeal swab, presence of viral mutation(s) within the areas targeted by this assay, and inadequate number of viral copies (<250 copies / mL). A negative result must be combined with clinical observations, patient history, and epidemiological information.  Fact Sheet for Patients:   StrictlyIdeas.no  Fact Sheet for Healthcare Providers: BankingDealers.co.za  This test is not yet approved or  cleared by the [place] FDA and has been authorized for detection and/or diagnosis of SARS-CoV-2 by FDA under an Emergency Use Authorization (EUA).  This EUA will remain in effect (meaning this test can be used) for the duration of  the COVID-19 declaration under Section 564(b)(1) of the Act, 21 U.S.C. section 360bbb-3(b)(1), unless the authorization is terminated or revoked sooner.  Performed at [facility], [address] [place], [place] [address]          Radiology Studies: DG Chest 2 View  Result Date: 11/19/2019 CLINICAL DATA:  Shortness of breath EXAM: CHEST - 2 VIEW COMPARISON:  11/11/2019 FINDINGS: Small to moderate bilateral pleural effusions, increased on the right. Superimposed pleural air on the right, which is also present on the prior study though now more apical rather than basilar is. Bibasilar atelectasis. Normal heart size. No acute osseous abnormality. IMPRESSION: Moderate right hydropneumothorax. Similar small to moderate left pleural effusion. Associated bilateral atelectasis. Electronically Signed   By: Macy Mis M.D.   On: 11/19/2019 09:17   DG Chest Port 1 View  Result Date: 11/20/2019 CLINICAL DATA:  Shortness of breath.  Chest pain EXAM: PORTABLE CHEST 1 VIEW COMPARISON:  11/19/2019. FINDINGS: Stable moderate right-sided pneumothorax. Heart size stable. Prominent bilateral pulmonary infiltrates/edema. Progressive right pleural effusion. Stable left pleural effusion. CHF could present in this fashion. Bilateral pneumonia cannot be excluded. Persistent bibasilar atelectasis. Surgical clips right chest. No acute bony abnormality. IMPRESSION: 1.  Stable moderate right-sided pneumothorax. 2. Prominent  bilateral pulmonary infiltrates/edema. Progressive right pleural effusion. Stable left pleural effusion. CHF could present in this fashion. Bilateral pneumonia cannot be excluded. 3.  Persistent bibasilar atelectasis. Electronically Signed   By: Marcello Moores  Register   On: 11/20/2019 07:13        Scheduled Meds: . atorvastatin  10 mg Oral Daily  . ezetimibe  10 mg Oral Daily  . letrozole  2.5 mg Oral Daily  . losartan  50 mg Oral BID  . mirabegron ER  50 mg Oral Daily  .  mirtazapine  7.5 mg Oral QHS  . multivitamin with minerals   Oral Daily  . senna  1 tablet Oral BID  . sodium chloride flush  3 mL Intravenous Q12H   Continuous Infusions: . sodium chloride    .  ceFAZolin (ANCEF) IV       LOS: 0 days    Time spent: 27 minutes    Sharen Hones, MD [facility]   To contact the attending provider between 7A-7P or the covering provider during after hours 7P-7A, please log into the web site [email or web address] and access using universal [place] password for that web site. If you do not have the password, please call the hospital operator.  11/20/2019, 2:30 PM

## 2019-11-20 NOTE — Progress Notes (Signed)
Initial Nutrition Assessment  DOCUMENTATION CODES:   Not applicable  INTERVENTION:   RD will add supplements pending SLP evaluation   NUTRITION DIAGNOSIS:   Increased nutrient needs related to cancer and cancer related treatments as evidenced by increased estimated needs.  GOAL:   Patient will meet greater than or equal to 90% of their needs  MONITOR:   PO intake, Supplement acceptance, Labs, Weight trends, Skin, I & O's  REASON FOR ASSESSMENT:   Malnutrition Screening Tool    ASSESSMENT:   86 y/o female with h/o bilateral mastectomy in 2008 for right breast cancer, not on any chemotherapy, hypothyroidism, hypertension, hyperlipidemia, CKD stage II, dementia, right upper extremity DVT, right sided thoracentesis on July 16 and August 17 by IR for metastatic breast cancer and supposed to start radiation therapy on August 26 who is admitted with right hydropneumothorax and fall  RD working remotely.  Unable to speak with pt via phone r/t dementia. RD suspects pt with decreased appetite and oral intake at baseline. Per RN report, pt coughing while eating; SLP evaluation pending. RD will add supplements pending SLP recommendations. There is no weight history in chart to determine if any significant recent weight changes. RD will obtain nutrition related history and exam at follow-up. Pt is at high risk for malnutrition.   Medications reviewed and include: remeron, MVI, senokot  Labs reviewed: Na 134(L), BUN 28(H)   NUTRITION - FOCUSED PHYSICAL EXAM: Unable to perform at this time   Diet Order:   Diet Order            Diet NPO time specified  Diet effective now                EDUCATION NEEDS:   Not appropriate for education at this time  Skin:  Skin Assessment: Reviewed RN Assessment (incision R shoulder)  Last BM:  8/23  Height:   Ht Readings from Last 1 Encounters:  11/19/19 5\' 6"  (1.676 m)    Weight:   Wt Readings from Last 1 Encounters:  11/19/19 54.4  kg    Ideal Body Weight:  59 kg  BMI:  Body mass index is 19.36 kg/m.  Estimated Nutritional Needs:   Kcal:  1400-1600kcal/day  Protein:  70-80g/day  Fluid:  1.4-1.6L/day   Koleen Distance MS, RD, LDN Please refer to [facility] for RD and/or RD on-call/weekend/after hours pager

## 2019-11-21 LAB — TYPE AND SCREEN
ABO/RH(D): AB POS
Antibody Screen: NEGATIVE

## 2019-11-21 LAB — SURGICAL PATHOLOGY

## 2019-11-21 MED ORDER — FENTANYL CITRATE (PF) 100 MCG/2ML IJ SOLN
INTRAMUSCULAR | Status: AC
  Filled 2019-11-21: qty 2

## 2019-11-21 MED ORDER — LIDOCAINE HCL (PF) 2 % IJ SOLN
INTRAMUSCULAR | Status: AC
  Filled 2019-11-21: qty 5

## 2019-11-21 MED ORDER — PROPOFOL 10 MG/ML IV BOLUS
INTRAVENOUS | Status: AC
  Filled 2019-11-21: qty 40

## 2019-11-21 MED ORDER — PROPOFOL 10 MG/ML IV BOLUS
INTRAVENOUS | Status: AC
  Filled 2019-11-21: qty 20

## 2019-11-21 MED ORDER — ONDANSETRON HCL 4 MG/2ML IJ SOLN
INTRAMUSCULAR | Status: AC
  Filled 2019-11-21: qty 2

## 2019-11-21 MED ORDER — PHENYLEPHRINE HCL (PRESSORS) 10 MG/ML IV SOLN
INTRAVENOUS | Status: AC
  Filled 2019-11-21: qty 1

## 2019-11-21 MED ORDER — ENOXAPARIN SODIUM 60 MG/0.6ML ~~LOC~~ SOLN
1.0000 mg/kg | Freq: Two times a day (BID) | SUBCUTANEOUS | Status: AC
  Administered 2019-11-21 – 2019-11-23 (×4): 55 mg via SUBCUTANEOUS
  Filled 2019-11-21 (×4): qty 0.6

## 2019-11-21 NOTE — Anesthesia Preprocedure Evaluation (Addendum)
Anesthesia Evaluation  Patient identified by MRN, date of birth, ID band Patient awake    Reviewed: Allergy & Precautions, H&P , NPO status , Patient's Chart, lab work & pertinent test results  History of Anesthesia Complications Negative for: history of anesthetic complications  Airway Mallampati: II  TM Distance: >3 FB Neck ROM: Full    Dental  (+) Poor Dentition   Pulmonary neg sleep apnea, neg COPD,  Recurrent right pleural effusion   breath sounds clear to auscultation- rhonchi (-) wheezing      Cardiovascular hypertension, Pt. on medications (-) angina(-) Past MI and (-) Cardiac Stents (-) dysrhythmias  Rhythm:Regular Rate:Normal - Systolic murmurs and - Diastolic murmurs    Neuro/Psych neg Seizures negative neurological ROS  negative psych ROS   GI/Hepatic negative GI ROS, Neg liver ROS,   Endo/Other  neg diabetesHypothyroidism   Renal/GU Renal disease (CKD)  negative genitourinary   Musculoskeletal negative musculoskeletal ROS (+)   Abdominal (+) - obese,   Peds  Hematology negative hematology ROS (+)   Anesthesia Other Findings Metastatic breast cancer Past Medical History: No date: Breast cancer (HCC) No date: Chronic kidney disease (CKD), stage II (mild) No date: Cyst of pancreas No date: Hyperlipidemia, unspecified No date: Hypertension No date: Hypothyroidism, unspecified No date: Nonrheumatic mitral (valve) insufficiency No date: Obstructive sleep apnea (adult) (pediatric)   Reproductive/Obstetrics negative OB ROS                            Anesthesia Physical Anesthesia Plan  ASA: III  Anesthesia Plan: General   Post-op Pain Management:    Induction: Intravenous  PONV Risk Score and Plan: 2 and Ondansetron and Dexamethasone  Airway Management Planned: Oral ETT  Additional Equipment:   Intra-op Plan:   Post-operative Plan: Extubation in OR  Informed  Consent: I have reviewed the patients History and Physical, chart, labs and discussed the procedure including the risks, benefits and alternatives for the proposed anesthesia with the patient or authorized representative who has indicated his/her understanding and acceptance.   Patient has DNR.  Discussed DNR with patient and Suspend DNR.   Dental advisory given  Plan Discussed with: CRNA and Anesthesiologist  Anesthesia Plan Comments:        Anesthesia Quick Evaluation

## 2019-11-21 NOTE — TOC Progression Note (Addendum)
Transition of Care [facility]) - Progression Note    Patient Details  Name: Savannah Galvan MRN: [id number] Date of Birth: 12-07-1929  Transition of Care [facility]) CM/SW [place], LCSW Phone Number: 11/21/2019, 10:18 AM  Clinical Narrative: Left another message for Elisha Headland, RN at [facility] to see if niece could manage pleurex at the facility.    11:30 am: No call back from Ms. [facility] yet. Per MD, will not place pleurex until Monday and patient could potential return to ALF today. CSW attempted calling niece, Trinda Pascal, per RN request. Left a voicemail.  2:53 pm: Received call back from Ms. [facility]. Discussed niece managing the pleurex at the facility. They prefer not to do this since she would not be entering documentation on patient's care. They are agreeable to taking her back as long as we set her up with home health. Their preferred provider is [facility]. Will discuss with niece when she calls back. Per MD, plan on discharge Monday after drain placement.   Expected Discharge Plan: Assisted Living Barriers to Discharge: Continued Medical Work up  Expected Discharge Plan and Services Expected Discharge Plan: Assisted Living       Living arrangements for the past 2 months: Assisted Living Facility                                       Social Determinants of Health (SDOH) Interventions    Readmission Risk Interventions No flowsheet data found.

## 2019-11-21 NOTE — Progress Notes (Signed)
  Speech Language Pathology Treatment: Dysphagia  Patient Details Name: Savannah Galvan MRN: [id number] DOB: 05-31-1929 Today's Date: 11/21/2019 Time: 3903-0092 SLP Time Calculation (min) (ACUTE ONLY): 35 min  Assessment / Plan / Recommendation Clinical Impression  Pt seen for ongoing assessment of swallowing. She engaged easily w/ SLP and was verbally responsive and able to follow instructions w/ min cues. Pt is on RA; wbc wnl. She spoke on the phone w/ family to update them and request items from home. Pt stated she was "not hungry" b/c she had eaten "well" at her breakfast meal "when it came".  Discussed and explained general aspiration precautions w/ pt; she agreed verbally to the need for following them especially sitting upright for all oral intake. Pt assisted w/ positioning more foreward then given trials of thin and bites of soup she had been eating from Lunch tray. No overt clinical s/s of aspiration were noted w/ any consistency; respiratory status remained calm and unlabored, vocal quality clear b/t trials. Pt fed self drinking slowly w/ single, small sips. She exhibited no difficulty using a straw w/ a drink. NSG arrived for Pills; she consumed w/ liquid and NSG taught and practiced Whole w/ a Puree for "back-up" method if pt so chose for ease of swallowing, especially larger pills. Oral phase appeared [facility] for bolus management and timely A-P transfer for swallowing; oral clearing achieved w/ all consistencies.  Recommend continue Dysphagia level 3 diet (mech soft for ease of eating/mastication) w/ gravies added to moisten foods; Thin liquids. Recommend general aspiration precautions; Pills Whole in Puree as needed; tray setup and positioning assistance for meals as needed. NSG to reconsult ST services if new needs arise while admitted. NSG updated. Precautions posted at bedside.      HPI HPI: Pt is a 86 y.o. female with a known history of status post bilateral mastectomy in 2008 four  history of right breast cancer. Patient has not received any chemotherapy. She has been on tamoxifen for five years. Hypothyroidism, hypertension, hyperlipidemia, CKD stage II, dementia, right upper extremity DVT on eliquis.  Patient recently underwent  Right sided thoracentesis on July 16 and August 17 by IR. Cytology consistent with metastatic breast cancer. Patient follows with radiation oncology Dr. Donella Stade who is supposed to start radiation therapy from August 26.  Pt was admitted with shortness of breath this admission.  CXR showing right hydropneumothorax which was essentially unchanged over the last few weeks.  Pt has had 2 thoracentesis with malignant cells in right pleural fluid.       SLP Plan  All goals met       Recommendations  Diet recommendations: Dysphagia 3 (mechanical soft);Thin liquid Liquids provided via: Cup;Straw Medication Administration: Whole meds with puree (if easier method for swallowing) Supervision: Patient able to self feed Compensations: Minimize environmental distractions;Slow rate;Small sips/bites;Follow solids with liquid Postural Changes and/or Swallow Maneuvers: Seated upright 90 degrees;Upright 30-60 min after meal                General recommendations:  (dietician as needed) Oral Care Recommendations: Oral care BID;Oral care before and after PO;Staff/trained caregiver to provide oral care Follow up Recommendations: None SLP Visit Diagnosis: Dysphagia, unspecified (R13.10) Plan: All goals met       GO                 Orinda Kenner, MS, CCC-SLP Giuseppe Duchemin 11/21/2019, 3:11 PM

## 2019-11-21 NOTE — Progress Notes (Signed)
PROGRESS NOTE    Savannah Galvan  MRN:[id number] DOB: 03/12/1930 DOA: 11/19/2019 PCP: Jones, Kathy, PA   Chief complaint pain shortness of breath.  Brief Narrative: Savannah Galvan a86 y.o.femalewith a known history of status post bilateral mastectomy in 2008 four history of right breast cancer. Patient has not received any chemotherapy. She has been on tamoxifen for five years. Hypothyroidism, hypertension, hyperlipidemia,, CKD stage II, dementia, right upper extremity DVT on eliquis  Patient recently underwentRightsided thoracentesis on July 16 and August 17 by IR. Cytology consistent with metastatic breast cancer. Patient follows with radiation oncology Dr. Crystal who is supposed to start radiation therapy from August 26.  8/27.  Surgery is delayed until Monday.  Restart therapeutic Lovenox, last dose Sunday morning.    Assessment & Plan:   Active Problems:   Metastatic breast cancer (HCC)   Hydropneumothorax   Hyponatremia  #1.  Recurrent right hydropneumothorax.  Secondary to metastatic breast cancer. Patient was not placed on n.p.o. this morning, as result, surgery is delayed until Monday.  Discussed with Dr. Oaks, he prefer to keep patient in hospital until Monday.  Patient can be discharged after surgery on Monday.  2.  Acute right arm DVT.  Patient still has significant right arm swelling, presume this is due to acute.  As a result, I will restart Lovenox at a therapeutic dose.  Last dose on Sunday.  3.  Generalized weakness. Continue physical therapy.  4.  Hyponatremia. Stable.  5.  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   DVT prophylaxis: Lovenox Code Status: DNR Family Communication: Patient condition updated to patient's POA. Disposition Plan:   Patient came from:                                                                                                                          Anticipated d/c place: ?   Barriers to d/c  OR conditions which need to be met to effect a safe d/c:   Consultants:   CT surgery  Procedures:None Antimicrobials: None    Subjective: Patient has some right arm swelling, short of breath with exertion.  No chest pain.  No fever chills.  No nausea vomiting diarrhea.  Objective: Vitals:   11/20/19 1507 11/20/19 1957 11/21/19 0344 11/21/19 1126  BP: 118/66 (!) 103/54 (!) 123/59 (!) 94/58  Pulse: 71 80 78 65  Resp: 16 20 16   Temp: 98.2 F (36.8 C) 98.9 F (37.2 C) 97.6 F (36.4 C) 97.6 F (36.4 C)  TempSrc: Oral Oral Oral Oral  SpO2: 91% 98% 100% 92%  Weight:      Height:        Intake/Output Summary (Last 24 hours) at 11/21/2019 1311 Last data filed at 11/21/2019 0343 Gross per 24 hour  Intake 240 ml  Output 0 ml  Net 240 ml   Filed Weights   11/19/19 0842 11/19/19 2055  Weight: 51 kg 54.4 kg    Examination:  General exam: Appears calm and   comfortable  Respiratory system: Decreased breathing sounds bilaterally. Respiratory effort normal. Cardiovascular system: S1 & S2 heard, RRR. No JVD, murmurs, rubs, gallops or clicks. No pedal edema. Gastrointestinal system: Abdomen is nondistended, soft and nontender. No organomegaly or masses felt. Normal bowel sounds heard. Central nervous system: Alert and oriented x2. No focal neurological deficits. Extremities: Right arm swelling. Skin: No rashes, lesions or ulcers Psychiatry: Judgement and insight appear normal. Mood & affect appropriate.     Data Reviewed: I have personally reviewed following labs and imaging studies  CBC: Recent Labs  Lab 11/19/19 0844  WBC 7.5  HGB 13.5  HCT 38.7  MCV 90.8  PLT 336   Basic Metabolic Panel: Recent Labs  Lab 11/19/19 0844  NA 134*  K 3.7  CL 94*  CO2 28  GLUCOSE 110*  BUN 28*  CREATININE 0.72  CALCIUM 9.5   GFR: Estimated Creatinine Clearance: 42.5 mL/min (by C-G formula based on SCr of 0.72 mg/dL). Liver Function Tests: No results for input(s): AST,  ALT, ALKPHOS, BILITOT, PROT, ALBUMIN in the last 168 hours. No results for input(s): LIPASE, AMYLASE in the last 168 hours. No results for input(s): AMMONIA in the last 168 hours. Coagulation Profile: Recent Labs  Lab 11/20/19 1303  INR 1.0   Cardiac Enzymes: No results for input(s): CKTOTAL, CKMB, CKMBINDEX, TROPONINI in the last 168 hours. BNP (last 3 results) No results for input(s): PROBNP in the last 8760 hours. HbA1C: No results for input(s): HGBA1C in the last 72 hours. CBG: No results for input(s): GLUCAP in the last 168 hours. Lipid Profile: No results for input(s): CHOL, HDL, LDLCALC, TRIG, CHOLHDL, LDLDIRECT in the last 72 hours. Thyroid Function Tests: No results for input(s): TSH, T4TOTAL, FREET4, T3FREE, THYROIDAB in the last 72 hours. Anemia Panel: No results for input(s): VITAMINB12, FOLATE, FERRITIN, TIBC, IRON, RETICCTPCT in the last 72 hours. Sepsis Labs: No results for input(s): PROCALCITON, LATICACIDVEN in the last 168 hours.  Recent Results (from the past 240 hour(s))  SARS Coronavirus 2 by RT PCR (hospital order, performed in [facility] hospital lab) Nasopharyngeal Nasopharyngeal Swab     Status: None   Collection Time: 11/19/19  2:06 PM   Specimen: Nasopharyngeal Swab  Result Value Ref Range Status   SARS Coronavirus 2 NEGATIVE NEGATIVE Final    Comment: (NOTE) SARS-CoV-2 target nucleic acids are NOT DETECTED.  The SARS-CoV-2 RNA is generally detectable in upper and lower respiratory specimens during the acute phase of infection. The lowest concentration of SARS-CoV-2 viral copies this assay can detect is 250 copies / mL. A negative result does not preclude SARS-CoV-2 infection and should not be used as the sole basis for treatment or other patient management decisions.  A negative result may occur with improper specimen collection / handling, submission of specimen other than nasopharyngeal swab, presence of viral mutation(s) within the areas  targeted by this assay, and inadequate number of viral copies (<250 copies / mL). A negative result must be combined with clinical observations, patient history, and epidemiological information.  Fact Sheet for Patients:   https://www.fda.gov/media/136312/download  Fact Sheet for Healthcare Providers: https://www.fda.gov/media/136313/download  This test is not yet approved or  cleared by the [place] FDA and has been authorized for detection and/or diagnosis of SARS-CoV-2 by FDA under an Emergency Use Authorization (EUA).  This EUA will remain in effect (meaning this test can be used) for the duration of the COVID-19 declaration under Section 564(b)(1) of the Act, 21 U.S.C. section 360bbb-3(b)(1), unless the authorization   is terminated or revoked sooner.  Performed at [facility], [address] [place], [place] [address]          Radiology Studies: [facility] Chest [place] 1 View  Result Date: 11/20/2019 CLINICAL DATA:  Shortness of breath.  Chest pain EXAM: PORTABLE CHEST 1 VIEW COMPARISON:  11/19/2019. FINDINGS: Stable moderate right-sided pneumothorax. Heart size stable. Prominent bilateral pulmonary infiltrates/edema. Progressive right pleural effusion. Stable left pleural effusion. CHF could present in this fashion. Bilateral pneumonia cannot be excluded. Persistent bibasilar atelectasis. Surgical clips right chest. No acute bony abnormality. IMPRESSION: 1.  Stable moderate right-sided pneumothorax. 2. Prominent bilateral pulmonary infiltrates/edema. Progressive right pleural effusion. Stable left pleural effusion. CHF could present in this fashion. Bilateral pneumonia cannot be excluded. 3.  Persistent bibasilar atelectasis. Electronically Signed   By: Marcello Moores  Register   On: 11/20/2019 07:13        Scheduled Meds: . atorvastatin  10 mg Oral Daily  . enoxaparin (LOVENOX) injection  1 mg/kg Subcutaneous Q12H  . ezetimibe  10 mg Oral Daily  . feeding supplement  (ENSURE ENLIVE)  237 mL Oral BID BM  . letrozole  2.5 mg Oral Daily  . losartan  50 mg Oral BID  . mirabegron ER  50 mg Oral Daily  . mirtazapine  7.5 mg Oral QHS  . multivitamin with minerals   Oral Daily  . senna  1 tablet Oral BID  . sodium chloride flush  3 mL Intravenous Q12H   Continuous Infusions: . sodium chloride    .  ceFAZolin (ANCEF) IV       LOS: 1 day    Time spent: 28 minutes    Sharen Hones, MD [facility]   To contact the attending provider between 7A-7P or the covering provider during after hours 7P-7A, please log into the web site [email or web address] and access using universal [place] password for that web site. If you do not have the password, please call the hospital operator.  11/21/2019, 1:11 PM

## 2019-11-21 NOTE — Progress Notes (Signed)
Patient ID: Savannah Galvan, female   DOB: 08-Oct-1929, 86 y.o.   MRN: [id number]  Unable to perform procedure today because patient ate breakfast.  Discussed with the patient and I will contact the niece to review as well  She should be NPO after midnight Sunday evening for surgery Monday morning around 0900 hours.  I would not restart her Eliquis at this time.    Could consider low dose heparin with the last dose to be given Sunday evening.    Remains stable from a respiratory standpoint at this time.  [employer].

## 2019-11-22 NOTE — Progress Notes (Signed)
Savannah Galvan Follow Up Note  Patient ID: Savannah Galvan, female   DOB: [birth year], 86 y.o.   MRN: [id number]  HISTORY: Overall she states that she has had a pretty good night.  She was more interactive this morning.  She only complains of slight shortness of breath.    Vitals:   11/21/19 2031 11/22/19 0539  BP: 106/60 (!) 118/59  Pulse: 75 61  Resp: 19 20  Temp: 98 F (36.7 C) 97.7 F (36.5 C)  SpO2: 95% 99%     EXAM:  Resp: Lungs are diminished bilaterally.  No respiratory distress, normal effort. Heart:  Regular without murmurs Abd:  Abdomen is soft, non distended and non tender. No masses are palpable.  There is no rebound and no guarding.  Neurological: Alert and oriented to person, place, and time. Coordination normal.  Skin: Skin is warm and dry. No rash noted. No diaphoretic. No erythema. No pallor.  Psychiatric: Normal mood and affect. Normal behavior. Judgment and thought content normal.      ASSESSMENT: Recurrent right-sided pleural effusion   PLAN:   We plan to perform a Pleurx catheter insertion on Monday.    Nestor Lewandowsky, MDPatient ID: Savannah Galvan, female   DOB: 1929-04-15, 86 y.o.   MRN: [id number]

## 2019-11-22 NOTE — Progress Notes (Signed)
PROGRESS NOTE    Savannah Galvan  YQM:[id number] DOB: 04-02-1929 DOA: 11/19/2019 PCP: Clide Deutscher, PA   Chief complaint.  Shortness of breath.  Brief Narrative:  Savannah Galvan. Patient has not received any chemotherapy. She has been on tamoxifen for five years. Hypothyroidism, hypertension, hyperlipidemia,, CKD stage II, dementia, right upper extremity DVT on eliquis  Patient recently underwentRightsided thoracentesis on July 16 and August 17 by IR. Cytology consistent with metastatic breast Galvan. Patient follows with radiation oncology Dr. Donella Stade who is supposed to start radiation therapy from August 26.  8/27.  Surgery is delayed until Monday.  Restart therapeutic Lovenox, last dose Sunday morning.    Assessment & Plan:   Active Problems:   Metastatic breast Galvan (HCC)   Hydropneumothorax   Hyponatremia  #1.  Recurrent right hydropneumothorax. Secondary to metastatic breast Galvan. Scheduled to have pleural catheter placed on Monday.  2.  Acute right arm DVT. Continue Lovenox therapeutic dose.  3.  Generalized weakness. Continue physical therapy.  4.  Hyponatremia.  Stable.  5.  Breast Galvan with recurrent malignant pleural effusion.   DVT prophylaxis:Lovenox Code Status:DNR Family Communication: None Disposition Plan:  Patient came from:   Anticipated d/c place:?  Barriers to d/c OR conditions which need to be met to effect a safe d/c:   Consultants:  CT surgery  Procedures:None Antimicrobials:None  Subjective: Patient has some confusion when she woke up.  Otherwise doing well.  No chest pain or shortness of breath today.  No fever chills.  No diarrhea.  Objective: Vitals:   11/21/19 0344  11/21/19 1126 11/21/19 2031 11/22/19 0539  BP: (!) 123/59 (!) 94/58 106/60 (!) 118/59  Pulse: 78 65 75 61  Resp: '16  19 20  ' Temp: 97.6 F (36.4 C) 97.6 F (36.4 C) 98 F (36.7 C) 97.7 F (36.5 C)  TempSrc: Oral Oral Oral Oral  SpO2: 100% 92% 95% 99%  Weight:      Height:        Intake/Output Summary (Last 24 hours) at 11/22/2019 0957 Last data filed at 11/22/2019 0600 Gross per 24 hour  Intake 480 ml  Output --  Net 480 ml   Filed Weights   11/19/19 0842 11/19/19 2055  Weight: 51 kg 54.4 kg    Examination:  General exam: Appears calm and comfortable  Respiratory system: Clear to auscultation. Respiratory effort normal. Cardiovascular system: S1 & S2 heard, RRR. No JVD, murmurs, rubs, gallops or clicks. No pedal edema. Gastrointestinal system: Abdomen is nondistended, soft and nontender. No organomegaly or masses felt. Normal bowel sounds heard. Central nervous system: Alert and oriented x2. No focal neurological deficits. Extremities: Right arm swelling. Skin: No rashes, lesions or ulcers Psychiatry:  Mood & affect appropriate.     Data Reviewed: I have personally reviewed following labs and imaging studies  CBC: Recent Labs  Lab 11/19/19 0844  WBC 7.5  HGB 13.5  HCT 38.7  MCV 90.8  PLT 528   Basic Metabolic Panel: Recent Labs  Lab 11/19/19 0844  NA 134*  K 3.7  CL 94*  CO2 28  GLUCOSE 110*  BUN 28*  CREATININE 0.72  CALCIUM 9.5   GFR: Estimated Creatinine Clearance: 42.5 mL/min (by C-G formula based on SCr of 0.72 mg/dL). Liver Function Tests: No results for input(s): AST, ALT, ALKPHOS, BILITOT, PROT, ALBUMIN in the last 168 hours. No results for input(s): LIPASE, AMYLASE in  the last 168 hours. No results for input(s): AMMONIA in the last 168 hours. Coagulation Profile: Recent Labs  Lab 11/20/19 1303  INR 1.0   Cardiac Enzymes: No results for input(s): CKTOTAL, CKMB, CKMBINDEX, TROPONINI in the last 168 hours. BNP (last 3 results) No  results for input(s): PROBNP in the last 8760 hours. HbA1C: No results for input(s): HGBA1C in the last 72 hours. CBG: No results for input(s): GLUCAP in the last 168 hours. Lipid Profile: No results for input(s): CHOL, HDL, LDLCALC, TRIG, CHOLHDL, LDLDIRECT in the last 72 hours. Thyroid Function Tests: No results for input(s): TSH, T4TOTAL, FREET4, T3FREE, THYROIDAB in the last 72 hours. Anemia Panel: No results for input(s): VITAMINB12, FOLATE, FERRITIN, TIBC, IRON, RETICCTPCT in the last 72 hours. Sepsis Labs: No results for input(s): PROCALCITON, LATICACIDVEN in the last 168 hours.  Recent Results (from the past 240 hour(s))  SARS Coronavirus 2 by RT PCR (hospital order, performed in [facility] hospital lab) Nasopharyngeal Nasopharyngeal Swab     Status: None   Collection Time: 11/19/19  2:06 PM   Specimen: Nasopharyngeal Swab  Result Value Ref Range Status   SARS Coronavirus 2 NEGATIVE NEGATIVE Final    Comment: (NOTE) SARS-CoV-2 target nucleic acids are NOT DETECTED.  The SARS-CoV-2 RNA is generally detectable in upper and lower respiratory specimens during the acute phase of infection. The lowest concentration of SARS-CoV-2 viral copies this assay can detect is 250 copies / mL. A negative result does not preclude SARS-CoV-2 infection and should not be used as the sole basis for treatment or other patient management decisions.  A negative result may occur with improper specimen collection / handling, submission of specimen other than nasopharyngeal swab, presence of viral mutation(s) within the areas targeted by this assay, and inadequate number of viral copies (<250 copies / mL). A negative result must be combined with clinical observations, patient history, and epidemiological information.  Fact Sheet for Patients:   StrictlyIdeas.no  Fact Sheet for Healthcare Providers: BankingDealers.co.za  This test is not yet approved  or  cleared by the [place] FDA and has been authorized for detection and/or diagnosis of SARS-CoV-2 by FDA under an Emergency Use Authorization (EUA).  This EUA will remain in effect (meaning this test can be used) for the duration of the COVID-19 declaration under Section 564(b)(1) of the Act, 21 U.S.C. section 360bbb-3(b)(1), unless the authorization is terminated or revoked sooner.  Performed at [facility], [address], [place], [place] [address]          Radiology Studies: No results found.      Scheduled Meds: . enoxaparin (LOVENOX) injection  1 mg/kg Subcutaneous Q12H  . feeding supplement (ENSURE ENLIVE)  237 mL Oral BID BM  . letrozole  2.5 mg Oral Daily  . losartan  50 mg Oral BID  . mirabegron ER  50 mg Oral Daily  . mirtazapine  7.5 mg Oral QHS  . multivitamin with minerals   Oral Daily  . senna  1 tablet Oral BID  . sodium chloride flush  3 mL Intravenous Q12H   Continuous Infusions: . sodium chloride    .  ceFAZolin (ANCEF) IV       LOS: 2 days    Time spent: 25 minutes    Sharen Hones, MD [facility]   To contact the attending provider between 7A-7P or the covering provider during after hours 7P-7A, please log into the web site [email or web address] and access using universal [place] password for  that web site. If you do not have the password, please call the hospital operator.  11/22/2019, 9:57 AM

## 2019-11-22 NOTE — H&P (View-Only) (Signed)
Savannah Galvan Follow Up Note  Patient ID: Savannah Galvan, female   DOB: Nov 07, 1929, 86 y.o.   MRN: [id number]  HISTORY: Overall she states that she has had a pretty good night.  She was more interactive this morning.  She only complains of slight shortness of breath.    Vitals:   11/21/19 2031 11/22/19 0539  BP: 106/60 (!) 118/59  Pulse: 75 61  Resp: 19 20  Temp: 98 F (36.7 C) 97.7 F (36.5 C)  SpO2: 95% 99%     EXAM:  Resp: Lungs are diminished bilaterally.  No respiratory distress, normal effort. Heart:  Regular without murmurs Abd:  Abdomen is soft, non distended and non tender. No masses are palpable.  There is no rebound and no guarding.  Neurological: Alert and oriented to person, place, and time. Coordination normal.  Skin: Skin is warm and dry. No rash noted. No diaphoretic. No erythema. No pallor.  Psychiatric: Normal mood and affect. Normal behavior. Judgment and thought content normal.      ASSESSMENT: Recurrent right-sided pleural effusion   PLAN:   We plan to perform a Pleurx catheter insertion on Monday.    Savannah Galvan, MDPatient ID: Savannah Galvan, female   DOB: Apr 28, 1929, 86 y.o.   MRN: [id number]

## 2019-11-23 LAB — CBC
HCT: 37.5 % (ref 36.0–46.0)
Hemoglobin: 12.3 g/dL (ref 12.0–15.0)
MCH: 31 pg (ref 26.0–34.0)
MCHC: 32.8 g/dL (ref 30.0–36.0)
MCV: 94.5 fL (ref 80.0–100.0)
Platelets: 282 10*3/uL (ref 150–400)
RBC: 3.97 MIL/uL (ref 3.87–5.11)
RDW: 12.9 % (ref 11.5–15.5)
WBC: 4.9 10*3/uL (ref 4.0–10.5)
nRBC: 0 % (ref 0.0–0.2)

## 2019-11-23 LAB — CREATININE, SERUM
Creatinine, Ser: 0.87 mg/dL (ref 0.44–1.00)
GFR calc Af Amer: >60 mL/min (ref >60)
GFR calc non Af Amer: 60 mL/min — ABNORMAL LOW (ref >60)

## 2019-11-23 NOTE — Progress Notes (Signed)
PROGRESS NOTE    Savannah Galvan  PTW:[id number] DOB: April 28, 1929 DOA: 11/19/2019 PCP: Clide Deutscher, PA   Chief complaint.  Shortness of breath. Brief Narrative: NancyWilliamsis a39 y.o.femalewith a known history of status post bilateral mastectomy in 2008 four history of right breast cancer. Patient has not received any chemotherapy. She has been on tamoxifen for five years. Hypothyroidism, hypertension, hyperlipidemia,, CKD stage II, dementia, right upper extremity DVT on eliquis  Patient recently underwentRightsided thoracentesis on July 16 and August 17 by IR. Cytology consistent with metastatic breast cancer. Patient follows with radiation oncology Dr. Donella Stade who is supposed to start radiation therapy from August 26.  8/27.Surgery is delayed until Monday. Restart therapeutic Lovenox, last dose Sunday morning.   Assessment & Plan:   Active Problems:   Metastatic breast cancer (HCC)   Hydropneumothorax   Hyponatremia  #1. Recurrent right hydropneumothorax with breast cancer. Secondary to metastatic breast cancer.  Patient currently stable, scheduled for Pleurx catheter on Monday.  2.  Acute right arm DVT. Lovenox hold off today's dose.  3.  Generalized weakness. Continue physical therapy.  4.  Hyponatremia.  Stable.    DVT prophylaxis:Lovenox Code Status:DNR Family Communication:None Disposition Plan:  Patient came from:   Anticipated d/c place:home tomorrow  Barriers to d/c OR conditions which need to be met to effect a safe d/c:   Consultants:  CT surgery  Procedures:None Antimicrobials:None    Subjective: Patient slept well last night.  Does not complain any short of breath today.  Less confused today.  No nausea vomiting abdominal pain.  Objective: Vitals:   11/22/19 1208 11/22/19 1954 11/23/19 0415  11/23/19 0941  BP: 111/71 (!) 103/58 (!) 100/43 (!) 111/55  Pulse: 86 68 (!) 57 (!) 58  Resp: '16  16 16  ' Temp: 97.8 F (36.6 C) 97.8 F (36.6 C) 98.5 F (36.9 C) 98.3 F (36.8 C)  TempSrc:  Oral  Oral  SpO2: 99% 92% 91% 96%  Weight:      Height:       No intake or output data in the 24 hours ending 11/23/19 0950 Filed Weights   11/19/19 0842 11/19/19 2055  Weight: 51 kg 54.4 kg    Examination:  General exam: Appears calm and comfortable  Respiratory system: Decreased breathing sounds bilaterally. Respiratory effort normal. Cardiovascular system: S1 & S2 heard, RRR. No JVD, murmurs, rubs, gallops or clicks. No pedal edema. Gastrointestinal system: Abdomen is nondistended, soft and nontender. No organomegaly or masses felt. Normal bowel sounds heard. Central nervous system: Alert and oriented. No focal neurological deficits. Extremities: Symmetric  Skin: No rashes, lesions or ulcers Psychiatry:  Mood & affect appropriate.     Data Reviewed: I have personally reviewed following labs and imaging studies  CBC: Recent Labs  Lab 11/19/19 0844 11/23/19 0622  WBC 7.5 4.9  HGB 13.5 12.3  HCT 38.7 37.5  MCV 90.8 94.5  PLT 336 700   Basic Metabolic Panel: Recent Labs  Lab 11/19/19 0844 11/23/19 0622  NA 134*  --   K 3.7  --   CL 94*  --   CO2 28  --   GLUCOSE 110*  --   BUN 28*  --   CREATININE 0.72 0.87  CALCIUM 9.5  --    GFR: Estimated Creatinine Clearance: 39.1 mL/min (by C-G formula based on SCr of 0.87 mg/dL). Liver Function Tests: No results for input(s): AST, ALT, ALKPHOS, BILITOT, PROT, ALBUMIN in the last 168 hours. No results for input(s): LIPASE, AMYLASE in  the last 168 hours. No results for input(s): AMMONIA in the last 168 hours. Coagulation Profile: Recent Labs  Lab 11/20/19 1303  INR 1.0   Cardiac Enzymes: No results for input(s): CKTOTAL, CKMB, CKMBINDEX, TROPONINI in the last 168 hours. BNP (last 3 results) No results for input(s):  PROBNP in the last 8760 hours. HbA1C: No results for input(s): HGBA1C in the last 72 hours. CBG: No results for input(s): GLUCAP in the last 168 hours. Lipid Profile: No results for input(s): CHOL, HDL, LDLCALC, TRIG, CHOLHDL, LDLDIRECT in the last 72 hours. Thyroid Function Tests: No results for input(s): TSH, T4TOTAL, FREET4, T3FREE, THYROIDAB in the last 72 hours. Anemia Panel: No results for input(s): VITAMINB12, FOLATE, FERRITIN, TIBC, IRON, RETICCTPCT in the last 72 hours. Sepsis Labs: No results for input(s): PROCALCITON, LATICACIDVEN in the last 168 hours.  Recent Results (from the past 240 hour(s))  SARS Coronavirus 2 by RT PCR (hospital order, performed in [facility] hospital lab) Nasopharyngeal Nasopharyngeal Swab     Status: None   Collection Time: 11/19/19  2:06 PM   Specimen: Nasopharyngeal Swab  Result Value Ref Range Status   SARS Coronavirus 2 NEGATIVE NEGATIVE Final    Comment: (NOTE) SARS-CoV-2 target nucleic acids are NOT DETECTED.  The SARS-CoV-2 RNA is generally detectable in upper and lower respiratory specimens during the acute phase of infection. The lowest concentration of SARS-CoV-2 viral copies this assay can detect is 250 copies / mL. A negative result does not preclude SARS-CoV-2 infection and should not be used as the sole basis for treatment or other patient management decisions.  A negative result may occur with improper specimen collection / handling, submission of specimen other than nasopharyngeal swab, presence of viral mutation(s) within the areas targeted by this assay, and inadequate number of viral copies (<250 copies / mL). A negative result must be combined with clinical observations, patient history, and epidemiological information.  Fact Sheet for Patients:   StrictlyIdeas.no  Fact Sheet for Healthcare Providers: BankingDealers.co.za  This test is not yet approved or  cleared by the  [place] FDA and has been authorized for detection and/or diagnosis of SARS-CoV-2 by FDA under an Emergency Use Authorization (EUA).  This EUA will remain in effect (meaning this test can be used) for the duration of the COVID-19 declaration under Section 564(b)(1) of the Act, 21 U.S.C. section 360bbb-3(b)(1), unless the authorization is terminated or revoked sooner.  Performed at [facility], [address] [place], [place] [address]          Radiology Studies: No results found.      Scheduled Meds: . feeding supplement (ENSURE ENLIVE)  237 mL Oral BID BM  . letrozole  2.5 mg Oral Daily  . losartan  50 mg Oral BID  . mirabegron ER  50 mg Oral Daily  . mirtazapine  7.5 mg Oral QHS  . multivitamin with minerals   Oral Daily  . senna  1 tablet Oral BID  . sodium chloride flush  3 mL Intravenous Q12H   Continuous Infusions: . sodium chloride    .  ceFAZolin (ANCEF) IV       LOS: 3 days    Time spent: 27 minutes    Sharen Hones, MD [facility]   To contact the attending provider between 7A-7P or the covering provider during after hours 7P-7A, please log into the web site [email or web address] and access using universal [place] password for that web site. If you do not have the password,  please call the hospital operator.  11/23/2019, 9:50 AM

## 2019-11-24 ENCOUNTER — Inpatient Hospital Stay: Payer: Medicare Other | Admitting: Anesthesiology

## 2019-11-24 ENCOUNTER — Inpatient Hospital Stay: Payer: Medicare Other

## 2019-11-24 ENCOUNTER — Encounter: Payer: Self-pay | Admitting: Internal Medicine

## 2019-11-24 ENCOUNTER — Encounter
Admission: EM | Disposition: A | Discharge status: Other Acute Inpatient Hospital | Payer: Self-pay | Source: Home / Self Care | Attending: Internal Medicine

## 2019-11-24 HISTORY — PX: CHEST TUBE INSERTION: SHX231

## 2019-11-24 SURGERY — CHEST TUBE INSERTION
Anesthesia: General | Site: Chest | Laterality: Right

## 2019-11-24 MED ORDER — FENTANYL CITRATE (PF) 100 MCG/2ML IJ SOLN
INTRAMUSCULAR | Status: DC | PRN
  Administered 2019-11-24: 25 ug via INTRAVENOUS

## 2019-11-24 MED ORDER — PROPOFOL 500 MG/50ML IV EMUL
INTRAVENOUS | Status: DC | PRN
  Administered 2019-11-24: 40 ug/kg/min via INTRAVENOUS

## 2019-11-24 MED ORDER — PROPOFOL 10 MG/ML IV BOLUS
INTRAVENOUS | Status: AC
  Filled 2019-11-24: qty 20

## 2019-11-24 MED ORDER — ONDANSETRON HCL 4 MG/2ML IJ SOLN: 4.0000 mg | Freq: Once | INTRAMUSCULAR | Status: DC | PRN

## 2019-11-24 MED ORDER — ALBUTEROL SULFATE (2.5 MG/3ML) 0.083% IN NEBU
2.5000 mg | INHALATION_SOLUTION | RESPIRATORY_TRACT | Status: DC
  Administered 2019-11-24 – 2019-11-25 (×4): 2.5 mg via RESPIRATORY_TRACT
  Filled 2019-11-24 (×4): qty 3

## 2019-11-24 MED ORDER — FENTANYL CITRATE (PF) 100 MCG/2ML IJ SOLN: 25.0000 ug | INTRAMUSCULAR | Status: DC | PRN

## 2019-11-24 MED ORDER — SODIUM CHLORIDE 0.9 % IV SOLN: INTRAVENOUS | Status: DC

## 2019-11-24 MED ORDER — MORPHINE SULFATE (PF) 2 MG/ML IV SOLN: 1.0000 mg | INTRAVENOUS | Status: DC | PRN

## 2019-11-24 MED ORDER — CEFAZOLIN SODIUM-DEXTROSE 2-4 GM/100ML-% IV SOLN
2.0000 g | Freq: Three times a day (TID) | INTRAVENOUS | Status: AC
  Administered 2019-11-24 – 2019-11-25 (×2): 2 g via INTRAVENOUS
  Filled 2019-11-24 (×3): qty 100

## 2019-11-24 MED ORDER — FENTANYL CITRATE (PF) 100 MCG/2ML IJ SOLN
INTRAMUSCULAR | Status: AC
  Filled 2019-11-24: qty 2

## 2019-11-24 MED ORDER — LIDOCAINE HCL (CARDIAC) PF 100 MG/5ML IV SOSY
PREFILLED_SYRINGE | INTRAVENOUS | Status: DC | PRN
  Administered 2019-11-24: 50 mg via INTRAVENOUS

## 2019-11-24 MED ORDER — LIDOCAINE HCL 1 % IJ SOLN
INTRAMUSCULAR | Status: DC | PRN
  Administered 2019-11-24: 13 mL

## 2019-11-24 MED ORDER — PROPOFOL 10 MG/ML IV BOLUS
INTRAVENOUS | Status: DC | PRN
  Administered 2019-11-24: 20 mg via INTRAVENOUS

## 2019-11-24 MED ORDER — ONDANSETRON HCL 4 MG/2ML IJ SOLN
INTRAMUSCULAR | Status: DC | PRN
  Administered 2019-11-24: 4 mg via INTRAVENOUS

## 2019-11-24 MED ORDER — APIXABAN 5 MG PO TABS
5.0000 mg | ORAL_TABLET | Freq: Two times a day (BID) | ORAL | Status: DC
  Administered 2019-11-24 – 2019-11-25 (×2): 5 mg via ORAL
  Filled 2019-11-24 (×2): qty 1

## 2019-11-24 MED ORDER — LOSARTAN POTASSIUM 50 MG PO TABS
50.0000 mg | ORAL_TABLET | Freq: Every day | ORAL | Status: DC
  Administered 2019-11-25: 50 mg via ORAL
  Filled 2019-11-24: qty 1

## 2019-11-24 SURGICAL SUPPLY — 34 items
BLADE SURG 15 STRL LF DISP TIS (BLADE) ×1 IMPLANT
BLADE SURG 15 STRL SS (BLADE) ×1
CANISTER SUCT 1200ML W/VALVE (MISCELLANEOUS) ×2 IMPLANT
CHLORAPREP W/TINT 26 (MISCELLANEOUS) ×2 IMPLANT
DRAIN CHEST DRY SUCT SGL (MISCELLANEOUS) ×2 IMPLANT
DRAPE INCISE IOBAN 66X45 STRL (DRAPES) ×2 IMPLANT
DRAPE LAPAROTOMY 77X122 PED (DRAPES) ×2 IMPLANT
ELECT REM PT RETURN 9FT ADLT (ELECTROSURGICAL) ×2
ELECTRODE REM PT RTRN 9FT ADLT (ELECTROSURGICAL) ×1 IMPLANT
GLOVE SURG SYN 7.5  E (GLOVE) ×1
GLOVE SURG SYN 7.5 E (GLOVE) ×1 IMPLANT
GLOVE SURG SYN 7.5 PF PI (GLOVE) ×1 IMPLANT
GOWN STRL REUS W/ TWL LRG LVL3 (GOWN DISPOSABLE) ×2 IMPLANT
GOWN STRL REUS W/TWL LRG LVL3 (GOWN DISPOSABLE) ×2
KIT PLEURX DRAIN CATH 500ML (KITS) ×2 IMPLANT
KIT TURNOVER KIT A (KITS) ×2 IMPLANT
LABEL OR SOLS (LABEL) ×2 IMPLANT
MARKER SKIN DUAL TIP RULER LAB (MISCELLANEOUS) ×2 IMPLANT
PACK BASIN MINOR (MISCELLANEOUS) ×2 IMPLANT
SUCTION FRAZIER HANDLE 10FR (MISCELLANEOUS) ×1
SUCTION TUBE FRAZIER 10FR DISP (MISCELLANEOUS) ×1 IMPLANT
SUT ETH BLK MONO 3 0 FS 1 12/B (SUTURE) ×4 IMPLANT
SUT ETHILON 4-0 (SUTURE) ×1
SUT ETHILON 4-0 FS2 18XMFL BLK (SUTURE) ×1
SUT SILK 0 (SUTURE) ×1
SUT SILK 0 30XBRD TIE 6 (SUTURE) ×1 IMPLANT
SUT SILK 1 SH (SUTURE) ×2 IMPLANT
SUT VIC AB 0 SH 27 (SUTURE) ×2 IMPLANT
SUT VIC AB 2-0 SH 27 (SUTURE) ×1
SUT VIC AB 2-0 SH 27XBRD (SUTURE) ×1 IMPLANT
SUT VIC AB 3-0 SH 27 (SUTURE) ×1
SUT VIC AB 3-0 SH 27X BRD (SUTURE) ×1 IMPLANT
SUTURE ETHLN 4-0 FS2 18XMF BLK (SUTURE) ×1 IMPLANT
WATER STERILE IRR 1000ML POUR (IV SOLUTION) ×2 IMPLANT

## 2019-11-24 NOTE — Care Management Important Message (Signed)
Important Message  Patient Details  Name: Savannah Galvan MRN: [id number] Date of Birth: 19-Apr-1929   Medicare Important Message Given:  Yes     Dannette Barbara 11/24/2019, 1:21 PM

## 2019-11-24 NOTE — Progress Notes (Signed)
PROGRESS NOTE    Savannah Galvan  MHD:[id number] DOB: 1930/01/19 DOA: 11/19/2019 PCP: Clide Deutscher, PA   Chief complaint.  Shortness of breath.  Brief Narrative:  NancyWilliamsis a74 y.o.femalewith a known history of status post bilateral mastectomy in 2008 four history of right breast cancer. Patient has not received any chemotherapy. She has been on tamoxifen for five years. Hypothyroidism, hypertension, hyperlipidemia,, CKD stage II, dementia, right upper extremity DVT on eliquis  Patient recently underwentRightsided thoracentesis on July 16 and August 17 by IR. Cytology consistent with metastatic breast cancer. Patient follows with radiation oncology Dr. Donella Stade who is supposed to start radiation therapy from August 26.  8/27.Surgery is delayed until Monday. Restart therapeutic Lovenox, last dose Sunday morning.  8/30.  Pleurx catheter placed.  Restart Eliquis.   Assessment & Plan:   Active Problems:   Metastatic breast cancer (HCC)   Hydropneumothorax   Hyponatremia  #1.  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 Status post a Pleurx catheter.  Discussed with Dr. Genevive Bi, planning to keep patient 1 more night.  2.  Acute right arm DVT. Continue Eliquis.  3.  Generalized weakness.  Continue physical therapy.  4.  Hyponatremia.  Stable.  DVT prophylaxis:Eliquis Code Status:DNR Family Communication:None Disposition Plan:  Patient came from:   Anticipated d/c place:home tomorrow  Barriers to d/c OR conditions which need to be met to effect a safe d/c:   Consultants:  CT surgery  Procedures:None Antimicrobials:None  Subjective: Patient had some sleepiness post procedure.  Not short of breath.  No nausea vomiting abdominal pain.  No fever chills.  Objective: Vitals:   11/24/19 1139 11/24/19 1204  11/24/19 1239 11/24/19 1403  BP:  123/70 132/79 (!) 101/44  Pulse: 68 67 67 72  Resp:  _0 Temp:  (!) 97.5 F (36.4 C) 97.6 F (36.4 C) 98.4 F (36.9 C)  TempSrc:  Oral Oral Oral  SpO2: 95% 96% 95% 98%  Weight:      Height:        Intake/Output Summary (Last 24 hours) at 11/24/2019 1416 Last data filed at 11/24/2019 1051 Gross per 24 hour  Intake 40 ml  Output 402 ml  Net -362 ml   Filed Weights   11/19/19 0842 11/19/19 2055 11/24/19 0919  Weight: 51 kg 54.4 kg 54 kg    Examination:  General exam: Appears calm and comfortable  Respiratory system: Creased breathing sounds bilaterally. Respiratory effort normal. Cardiovascular system: S1 & S2 heard, RRR. No JVD, murmurs, rubs, gallops or clicks. No pedal edema. Gastrointestinal system: Abdomen is nondistended, soft and nontender. No organomegaly or masses felt. Normal bowel sounds heard. Central nervous system: Drowsy and oriented. No focal neurological deficits. Extremities: Right arm swelling Skin: No rashes, lesions or ulcers Psychiatry:  Mood & affect appropriate.     Data Reviewed: I have personally reviewed following labs and imaging studies  CBC: Recent Labs  Lab 11/19/19 0844 11/23/19 0622  WBC 7.5 4.9  HGB 13.5 12.3  HCT 38.7 37.5  MCV 90.8 94.5  PLT 336 211   Basic Metabolic Panel: Recent Labs  Lab 11/19/19 0844 11/23/19 0622  NA 134*  --   K 3.7  --   CL 94*  --   CO2 28  --   GLUCOSE 110*  --   BUN 28*  --   CREATININE 0.72 0.87  CALCIUM 9.5  --    GFR: Estimated Creatinine Clearance: 38.8 mL/min (by C-G formula based on SCr of 0.87  mg/dL). Liver Function Tests: No results for input(s): AST, ALT, ALKPHOS, BILITOT, PROT, ALBUMIN in the last 168 hours. No results for input(s): LIPASE, AMYLASE in the last 168 hours. No results for input(s): AMMONIA in the last 168 hours. Coagulation Profile: Recent Labs  Lab 11/20/19 1303  INR 1.0   Cardiac Enzymes: No results for input(s):  CKTOTAL, CKMB, CKMBINDEX, TROPONINI in the last 168 hours. BNP (last 3 results) No results for input(s): PROBNP in the last 8760 hours. HbA1C: No results for input(s): HGBA1C in the last 72 hours. CBG: No results for input(s): GLUCAP in the last 168 hours. Lipid Profile: No results for input(s): CHOL, HDL, LDLCALC, TRIG, CHOLHDL, LDLDIRECT in the last 72 hours. Thyroid Function Tests: No results for input(s): TSH, T4TOTAL, FREET4, T3FREE, THYROIDAB in the last 72 hours. Anemia Panel: No results for input(s): VITAMINB12, FOLATE, FERRITIN, TIBC, IRON, RETICCTPCT in the last 72 hours. Sepsis Labs: No results for input(s): PROCALCITON, LATICACIDVEN in the last 168 hours.  Recent Results (from the past 240 hour(s))  SARS Coronavirus 2 by RT PCR (hospital order, performed in [facility] hospital lab) Nasopharyngeal Nasopharyngeal Swab     Status: None   Collection Time: 11/19/19  2:06 PM   Specimen: Nasopharyngeal Swab  Result Value Ref Range Status   SARS Coronavirus 2 NEGATIVE NEGATIVE Final    Comment: (NOTE) SARS-CoV-2 target nucleic acids are NOT DETECTED.  The SARS-CoV-2 RNA is generally detectable in upper and lower respiratory specimens during the acute phase of infection. The lowest concentration of SARS-CoV-2 viral copies this assay can detect is 250 copies / mL. A negative result does not preclude SARS-CoV-2 infection and should not be used as the sole basis for treatment or other patient management decisions.  A negative result may occur with improper specimen collection / handling, submission of specimen other than nasopharyngeal swab, presence of viral mutation(s) within the areas targeted by this assay, and inadequate number of viral copies (<250 copies / mL). A negative result must be combined with clinical observations, patient history, and epidemiological information.  Fact Sheet for Patients:   StrictlyIdeas.no  Fact Sheet for Healthcare  Providers: BankingDealers.co.za  This test is not yet approved or  cleared by the [place] FDA and has been authorized for detection and/or diagnosis of SARS-CoV-2 by FDA under an Emergency Use Authorization (EUA).  This EUA will remain in effect (meaning this test can be used) for the duration of the COVID-19 declaration under Section 564(b)(1) of the Act, 21 U.S.C. section 360bbb-3(b)(1), unless the authorization is terminated or revoked sooner.  Performed at [facility], [address], [place], [place] [address]          Radiology Studies: [facility] Chest [place] 1 View  Result Date: 11/24/2019 CLINICAL DATA:  Chest tube placement. EXAM: PORTABLE CHEST 1 VIEW COMPARISON:  Same day. FINDINGS: Stable cardiomediastinal silhouette. Right pleural drainage catheter appears to been repositioned with tip directed more superiorly. Mild right apical pneumothorax is noted which is significantly decreased compared to prior exam. Decreased right pleural effusion is noted. Stable left pleural effusion is noted. Bony thorax is unremarkable. IMPRESSION: Right pleural drainage catheter appears to been repositioned with tip directed more superiorly. Mild right apical pneumothorax is noted which is significantly decreased compared to prior exam. Decreased right pleural effusion is noted. Stable left pleural effusion. Aortic Atherosclerosis (ICD10-I70.0). Electronically Signed   By: Marijo Conception M.D.   On: 11/24/2019 13:53   DG Chest [facility] 1 View  Result  Date: 11/24/2019 CLINICAL DATA:  Status post chest tube placement. EXAM: PORTABLE CHEST 1 VIEW COMPARISON:  11/20/2019 FINDINGS: Interval progression of right pneumothorax, now moderate in size. Right pleural drain overlies the lower right hemithorax. Persistent bibasilar collapse/consolidative opacity with bilateral pleural effusions. Cardiopericardial silhouette is at upper limits of normal for size. The visualized bony  structures of the thorax show now acute abnormality. Telemetry leads overlie the chest. IMPRESSION: 1. Interval progression of right pneumothorax, now moderate in size. Right pleural drain overlies the lower right hemithorax. 2. Persistent bibasilar collapse/consolidative opacity with bilateral pleural effusions. Electronically Signed   By: Misty Stanley M.D.   On: 11/24/2019 11:24        Scheduled Meds: . albuterol  2.5 mg Nebulization Q4H while awake  . apixaban  5 mg Oral BID  . feeding supplement (ENSURE ENLIVE)  237 mL Oral BID BM  . letrozole  2.5 mg Oral Daily  . [START ON 11/25/2019] losartan  50 mg Oral Daily  . mirabegron ER  50 mg Oral Daily  . mirtazapine  7.5 mg Oral QHS  . multivitamin with minerals   Oral Daily  . senna  1 tablet Oral BID  . sodium chloride flush  3 mL Intravenous Q12H   Continuous Infusions: . sodium chloride    .  ceFAZolin (ANCEF) IV       LOS: 4 days    Time spent: 28 minutes    Sharen Hones, MD [facility]   To contact the attending provider between 7A-7P or the covering provider during after hours 7P-7A, please log into the web site [email or web address] and access using universal [place] password for that web site. If you do not have the password, please call the hospital operator.  11/24/2019, 2:16 PM

## 2019-11-24 NOTE — Transfer of Care (Signed)
Immediate Anesthesia Transfer of Care Note  Patient: Savannah Galvan  Procedure(s) Performed: CHEST TUBE INSERTION PLEURX (Right Chest)  Patient Location: PACU  Anesthesia Type:MAC and General  Level of Consciousness: awake  Airway & Oxygen Therapy: Patient Spontanous Breathing  Post-op Assessment: Report given to RN  Post vital signs: stable  Last Vitals:  Vitals Value Taken Time  BP    Temp    Pulse    Resp 15 11/24/19 1052  SpO2    Vitals shown include unvalidated device data.  Last Pain:  Vitals:   11/24/19 0919  TempSrc: Tympanic  PainSc: Asleep         Complications: No complications documented.

## 2019-11-24 NOTE — Op Note (Signed)
  11/24/2019  11:26 AM  PATIENT:  Savannah Galvan  86 y.o. female  PRE-OPERATIVE DIAGNOSIS:  Malignant pleural effusion  POST-OPERATIVE DIAGNOSIS:  Same   PROCEDURE:  Insertion of rigth sided PleurX catheter  SURGEON:  Surgeon(s) and Role:    Nestor Lewandowsky, MD - Primary  ASSISTANTS: none  ANESTHESIA: sedation with local  INDICATIONS FOR PROCEDURE malignant pleural effusion  DICTATION: Brought to the OR for insertion of PleurX catheter.  Patient identified and time out performed.  Positioned left decubitus.  Prepped and draped in the usual standard fashion.  Local used to anesthetize an area at the 5th interspace mid axillary line.  Skin tunnel anesthetized as well.  2 incisons made for insertion of catheter and for catheter exit at skin level.  The pleural space accessed with air and fluid and wire inserted easily.  Catheter inserted through the tract.  Dilator placed and catheter inserted.  Placed to suction.  Air and fluid (clear yellow) removed.  300 cc of fluid in Pleurevac at the time of transfer to the PACU.  Catheter secured with silk.  Wound closed with Nylon.  Sterile dressings applied.  Taken to PACU in stable condition.   Nestor Lewandowsky, MD

## 2019-11-24 NOTE — Anesthesia Postprocedure Evaluation (Signed)
Anesthesia Post Note  Patient: Savannah Galvan  Procedure(s) Performed: CHEST TUBE INSERTION PLEURX (Right Chest)  Patient location during evaluation: PACU Anesthesia Type: General Level of consciousness: awake and alert and oriented Pain management: pain level controlled Vital Signs Assessment: post-procedure vital signs reviewed and stable Respiratory status: spontaneous breathing, nonlabored ventilation and respiratory function stable Cardiovascular status: blood pressure returned to baseline and stable Postop Assessment: no signs of nausea or vomiting Anesthetic complications: no   No complications documented.   Last Vitals:  Vitals:   11/24/19 1204 11/24/19 1239  BP: 123/70 132/79  Pulse: 67 67  Resp: 16 18  Temp: (!) 36.4 C 36.4 C  SpO2: 96% 95%    Last Pain:  Vitals:   11/24/19 1239  TempSrc: Oral  PainSc:                  Dereke Neumann

## 2019-11-24 NOTE — Interval H&P Note (Signed)
History and Physical Interval Note:  11/24/2019 9:20 AM  Savannah Galvan  has presented today for surgery, with the diagnosis of RIGHT CHEST RECURRENT EFFUSION.  The various methods of treatment have been discussed with the patient and family. After consideration of risks, benefits and other options for treatment, the patient has consented to  Procedure(s): CHEST TUBE INSERTION PLEURX (Right) as a surgical intervention.  The patient's history has been reviewed, patient examined, no change in status, stable for surgery.  I have reviewed the patient's chart and labs.  Questions were answered to the patient's satisfaction.     Nestor Lewandowsky

## 2019-11-24 NOTE — TOC Progression Note (Addendum)
Transition of Care [facility]) - Progression Note    Patient Details  Name: Savannah Galvan MRN: [id number] Date of Birth: Nov 15, 1929  Transition of Care [facility]) CM/SW [place], LCSW Phone Number: 11/24/2019, 10:28 AM  Clinical Narrative:  CSW spoke to patient's niece. She is agreeable to home health referral to [facility]. Referral made but they are concerned that they will only be able to manage her drain 2-3 days a week. CSW discussed with Elisha Headland, RN at [facility]. She will speak with their executive director that is also a nurse to see if she can help manage the drain. CSW asked patient's unit RN to call Colletta Maryland and explain care that will be needed. He will call once patient has returned to the unit and he has evaluated what she has.   3:20 pm: Colletta Maryland is in contact with executive director to figure out if they can take patient back with the pleurex or not and have her niece fill in on days when home health cannot come out. Per Dr. Genevive Bi, it needs to be drained daily for the first week until he sees her. CSW called niece and mentioned potential SNF placement. She does not believe patient would take that well.  Expected Discharge Plan: Assisted Living Barriers to Discharge: Continued Medical Work up  Expected Discharge Plan and Services Expected Discharge Plan: Assisted Living       Living arrangements for the past 2 months: Assisted Living Facility                                       Social Determinants of Health (SDOH) Interventions    Readmission Risk Interventions No flowsheet data found.

## 2019-11-25 ENCOUNTER — Inpatient Hospital Stay: Payer: Medicare Other

## 2019-11-25 LAB — CBC WITH DIFFERENTIAL/PLATELET
Abs Immature Granulocytes: 0.02 10*3/uL (ref 0.00–0.07)
Basophils Absolute: 0 10*3/uL (ref 0.0–0.1)
Basophils Relative: 0 %
Eosinophils Absolute: 0.7 10*3/uL — ABNORMAL HIGH (ref 0.0–0.5)
Eosinophils Relative: 9 %
HCT: 38.2 % (ref 36.0–46.0)
Hemoglobin: 12.9 g/dL (ref 12.0–15.0)
Immature Granulocytes: 0 %
Lymphocytes Relative: 16 %
Lymphs Abs: 1.3 10*3/uL (ref 0.7–4.0)
MCH: 31.3 pg (ref 26.0–34.0)
MCHC: 33.8 g/dL (ref 30.0–36.0)
MCV: 92.7 fL (ref 80.0–100.0)
Monocytes Absolute: 0.6 10*3/uL (ref 0.1–1.0)
Monocytes Relative: 8 %
Neutro Abs: 5.2 10*3/uL (ref 1.7–7.7)
Neutrophils Relative %: 67 %
Platelets: 296 10*3/uL (ref 150–400)
RBC: 4.12 MIL/uL (ref 3.87–5.11)
RDW: 13.2 % (ref 11.5–15.5)
WBC: 7.8 10*3/uL (ref 4.0–10.5)
nRBC: 0 % (ref 0.0–0.2)

## 2019-11-25 LAB — BASIC METABOLIC PANEL
Anion gap: 7 (ref 5–15)
BUN: 17 mg/dL (ref 8–23)
CO2: 26 mmol/L (ref 22–32)
Calcium: 8.7 mg/dL — ABNORMAL LOW (ref 8.9–10.3)
Chloride: 101 mmol/L (ref 98–111)
Creatinine, Ser: 0.63 mg/dL (ref 0.44–1.00)
GFR calc Af Amer: >60 mL/min (ref >60)
GFR calc non Af Amer: >60 mL/min (ref >60)
Glucose, Bld: 110 mg/dL — ABNORMAL HIGH (ref 70–99)
Potassium: 4.2 mmol/L (ref 3.5–5.1)
Sodium: 134 mmol/L — ABNORMAL LOW (ref 135–145)

## 2019-11-25 LAB — MAGNESIUM: Magnesium: 2 mg/dL (ref 1.7–2.4)

## 2019-11-25 MED ORDER — LACTULOSE 10 GM/15ML PO SOLN
20.0000 g | Freq: Once | ORAL | Status: AC
  Administered 2019-11-25: 20 g via ORAL
  Filled 2019-11-25: qty 30

## 2019-11-25 NOTE — Plan of Care (Signed)
Continuing with plan of care. 

## 2019-11-25 NOTE — Progress Notes (Signed)
No complaints this morning but states that her breathing is not much better after surgery  No air leak seen  Drainage has been about 2.5 liters since surgery  Dressing clean and dry  Will check CXray and make recommendations after for management of PleurX catheter  [employer]

## 2019-11-25 NOTE — Discharge Summary (Signed)
Physician Discharge Summary  Patient ID: Savannah Galvan MRN: [id number] DOB/AGE: 11-14-1929 86 y.o.  Admit date: 11/19/2019 Discharge date: 11/25/2019  Admission Diagnoses:  Discharge Diagnoses:  Active Problems:   Metastatic breast cancer (HCC)   Hydropneumothorax   Hyponatremia   Discharged Condition: good  Hospital Course:  NancyWilliamsis a86 y.o.femalewith a known history of status post bilateral mastectomy in 2008 four history of right breast cancer. Patient has not received any chemotherapy. She has been on tamoxifen for five years. Hypothyroidism, hypertension, hyperlipidemia,, CKD stage II, dementia, right upper extremity DVT on eliquis  Patient recently underwentRightsided thoracentesis on July 16 and August 17 by IR. Cytology consistent with metastatic breast cancer. Patient follows with radiation oncology Dr. Donella Stade who is supposed to start radiation therapy from August 26.  8/27.Surgery is delayed until Monday. Restart therapeutic Lovenox, last dose Sunday morning.  8/30.  Pleurx catheter placed.  Restart Eliquis.  8/31.  Pleurx repositioning was done yesterday by Dr.Oaks, chest x-ray much improved.  Patient has been cleared by CT surgery for discharge.   #1.  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 Status post a Pleurx catheter.    Home care nurse visit will be scheduled to manage the Pleurx catheter.  Patient is medically stable to be transferred back to assisted living facility.  2.  Acute right arm DVT. Continue Eliquis.  3.  Generalized weakness.  Continue physical therapy.  4.  Hyponatremia.  Stable.  Consults: CT surgery  Significant Diagnostic Studies:  PORTABLE CHEST 1 VIEW  COMPARISON:  11/24/2019.  FINDINGS: Right chest tube again noted with tip now noted over the right lower chest. Persistent but improved right-sided pneumothorax. Heart size stable. Bilateral pulmonary infiltrates/edema noted on  today's exam. Bibasilar atelectasis. Bilateral pleural effusions again noted. Surgical clips right chest.  IMPRESSION: 1. Right chest tube again noted with tip now noted over the right lower chest. Persistent but improved right-sided pneumothorax.  2. Bilateral pulmonary infiltrates/edema noted on today's exam. Bibasilar atelectasis. Bilateral pleural effusions again noted.   Electronically Signed   By: [place]   On: 11/25/2019 07:44   Treatments: Pleurx catheter placement.  Discharge Exam: Blood pressure (!) 95/47, pulse 70, temperature 98.2 F (36.8 C), temperature source Oral, resp. rate 16, height 5\' 6"  (1.676 m), weight 54 kg, SpO2 95 %. General appearance: alert, cooperative and Orientated to herself and place. Resp: Decreased breathing sounds bilaterally. Cardio: regular rate and rhythm, S1, S2 normal, no murmur, click, rub or gallop GI: soft, non-tender; bowel sounds normal; no masses,  no organomegaly Extremities: extremities normal, atraumatic, no cyanosis or edema  Disposition: Discharge disposition: 01-Home or Self Care       Discharge Instructions    Diet - low sodium heart healthy   Complete by: As directed    Discharge wound care:   Complete by: As directed    Followed by RN   Discharge wound care:   Complete by: As directed    Followed by visiting RN   Increase activity slowly   Complete by: As directed    Increase activity slowly   Complete by: As directed      Allergies as of 11/25/2019      Reactions   Demerol [meperidine] Nausea And Vomiting      Medication List    STOP taking these medications   hydrochlorothiazide 25 MG tablet Commonly known as: HYDRODIURIL     TAKE these medications   Eliquis 5 MG Tabs tablet Generic drug: apixaban Take  5 mg by mouth 2 (two) times daily.   letrozole 2.5 MG tablet Commonly known as: FEMARA Take 1 tablet (2.5 mg total) by mouth daily.   losartan 50 MG tablet Commonly known as:  COZAAR Take 50 mg by mouth 2 (two) times daily.   mirtazapine 7.5 MG tablet Commonly known as: REMERON Take by mouth.   multivitamin tablet Take 1 tablet by mouth daily.   Myrbetriq 50 MG Tb24 tablet Generic drug: mirabegron ER Take 50 mg by mouth daily.   NUTRITIONAL SHAKE PLUS PROTEIN PO Take by mouth daily.   vitamin B-12 1000 MCG tablet Commonly known as: CYANOCOBALAMIN Take 1,000 mcg by mouth daily.            Discharge Care Instructions  (From admission, onward)         Start     Ordered   11/25/19 0000  Discharge wound care:       Comments: Followed by visiting RN   11/25/19 0914   11/24/19 0000  Discharge wound care:       Comments: Followed by RN   11/24/19 1346          Follow-up Information    Clide Deutscher, PA Follow up in 1 week(s).   Specialty: General Practice Contact information: [place] [address] [phone]        Nestor Lewandowsky, MD Follow up in 3 week(s).   Specialties: Cardiothoracic Surgery, General Surgery Contact information: [address] [place] [address] [phone]               Signed: Sharen Hones 11/25/2019, 9:14 AM

## 2019-11-25 NOTE — Plan of Care (Signed)
Report given to med tech at [facility], patient is in stable condition.

## 2019-11-25 NOTE — TOC Transition Note (Signed)
Transition of Care [facility]) - CM/SW Discharge Note   Patient Details  Name: Savannah Galvan MRN: [id number] Date of Birth: 1930-03-10  Transition of Care [facility] CM/SW Contact:  Candie Chroman, LCSW Phone Number: 11/25/2019, 3:52 PM   Clinical Narrative:  Patient has orders to discharge to [facility] today. [facility] aware. RN has already called report. EMS transport has been arranged. No further concerns. CSW signing off.  Final next level of care: Assisted Living (with home health) Barriers to Discharge: Barriers Resolved   Patient Goals and CMS Choice     Choice offered to / list presented to : [facility] POA / Guardian  Discharge Placement                Patient to be transferred to facility by: EMS Name of family member notified: Meghan Allgeyer Patient and family notified of of transfer: 11/25/19  Discharge Plan and Services                          HH Arranged: RN [facility] Agency: [facility] [facility] [employer] Date [place]: 11/25/19   Representative spoke with at [place]: Kimberly ([place]) Interventions     Readmission Risk Interventions No flowsheet data found.

## 2019-11-25 NOTE — Progress Notes (Signed)
Chest xray shows trace apical pneumothorax and is satisfactory for discharge from my standpoint.  She should have her PleurX catheter drained daily and volume recorded for my review  She should be seen in my office next Friday for suture removal  She should have a chest xray prior to her office visit with me  Marta Lamas.

## 2019-11-25 NOTE — NC FL2 (Signed)
[place] LEVEL OF CARE SCREENING TOOL     IDENTIFICATION  Patient Name: Savannah Galvan Birthdate: 1929-07-02 Sex: female Admission Date (Current Location): 11/19/2019  [place] and [place] Number:  Engineering geologist and Address:  [facility], [address], [place],  [address]      Provider Number: [id number]  Attending Physician Name and Address:  Sharen Hones, MD  Relative Name and Phone Number:       Current Level of Care: Hospital Recommended Level of Care: [place] (with RN through [facility]) Prior Approval Number:    Date Approved/Denied:   PASRR Number:    Discharge Plan: Other (Comment) (ALF with RN through [facility])    Current Diagnoses: Patient Active Problem List   Diagnosis Date Noted  . Hydropneumothorax 11/19/2019  . Hyponatremia   . Goals of care, counseling/discussion 11/06/2019  . Metastatic breast cancer ([place]) 11/06/2019    Orientation RESPIRATION BLADDER Height & Weight     Self  Normal Continent Weight: 119 lb 0.8 oz (54 kg) Height:  5\' 6"  (167.6 cm)  BEHAVIORAL SYMPTOMS/MOOD NEUROLOGICAL BOWEL NUTRITION STATUS   (None)  (None) Continent Diet (Low sodium, heart healthy)  AMBULATORY STATUS COMMUNICATION OF NEEDS Skin     Verbally Surgical wounds (Incisions on right anterior shoulder (foam prn) and right chest (gauze).)                       Personal Care Assistance Level of Assistance              Functional Limitations Info  Sight, Hearing, Speech Sight Info: Adequate Hearing Info: Adequate Speech Info: Adequate    SPECIAL CARE FACTORS FREQUENCY                       Contractures Contractures Info: Not present    Additional Factors Info  Code Status, Allergies Code Status Info: DNR Allergies Info: Demerol (Meperidine)           Current Medications (11/25/2019):  This is the current hospital active medication list Current Facility-Administered  Medications  Medication Dose Route Frequency Provider Last Rate Last Admin  . 0.9 %  sodium chloride infusion  250 mL Intravenous PRN Fritzi Mandes, MD   New Bag at 11/24/19 [phone]  . acetaminophen (TYLENOL) tablet 650 mg  650 mg Oral Q6H PRN Fritzi Mandes, MD   650 mg at 11/23/19 1752   Or  . acetaminophen (TYLENOL) suppository 650 mg  650 mg Rectal Q6H PRN Fritzi Mandes, MD      . albuterol (PROVENTIL) (2.5 MG/3ML) 0.083% nebulizer solution 2.5 mg  2.5 mg Nebulization Q2H PRN Fritzi Mandes, MD      . albuterol (PROVENTIL) (2.5 MG/3ML) 0.083% nebulizer solution 2.5 mg  2.5 mg Nebulization Q4H while awake Nestor Lewandowsky, MD   2.5 mg at 11/25/19 0802  . apixaban (ELIQUIS) tablet 5 mg  5 mg Oral BID Sharen Hones, MD   5 mg at 11/25/19 0845  . feeding supplement (ENSURE ENLIVE) (ENSURE ENLIVE) liquid 237 mL  237 mL Oral BID BM Sharen Hones, MD   237 mL at 11/25/19 0844  . letrozole [facility]) tablet 2.5 mg  2.5 mg Oral Daily Fritzi Mandes, MD   2.5 mg at 11/25/19 0843  . losartan (COZAAR) tablet 50 mg  50 mg Oral Daily Sharen Hones, MD   50 mg at 11/25/19 0844  . mirabegron ER (MYRBETRIQ) tablet 50 mg  50 mg Oral Daily Fritzi Mandes, MD   50 mg at 11/25/19 0843  . mirtazapine (REMERON) tablet 7.5 mg  7.5 mg Oral QHS Fritzi Mandes, MD   7.5 mg at 11/24/19 2035  . morphine 2 MG/ML injection 1-2 mg  1-2 mg Intravenous Q1H PRN Nestor Lewandowsky, MD      . multivitamin with minerals tablet   Oral Daily Fritzi Mandes, MD   1 tablet at 11/25/19 0844  . ondansetron (ZOFRAN) tablet 4 mg  4 mg Oral Q6H PRN Fritzi Mandes, MD      . polyethylene glycol (MIRALAX / GLYCOLAX) packet 17 g  17 g Oral Daily PRN Fritzi Mandes, MD      . senna (SENOKOT) tablet 8.6 mg  1 tablet Oral BID Fritzi Mandes, MD   8.6 mg at 11/25/19 0844  . sodium chloride flush (NS) 0.9 % injection 3 mL  3 mL Intravenous Q12H Fritzi Mandes, MD   3 mL at 11/25/19 0845  . sodium chloride flush (NS) 0.9 % injection 3 mL  3 mL Intravenous PRN Fritzi Mandes, MD         Discharge  Medications: Please see discharge summary for a list of discharge medications.  Relevant Imaging Results:  Relevant Lab Results:   Additional Information SS#: [id number]. Has a pleurex catheter.  Candie Chroman, LCSW

## 2019-11-25 NOTE — TOC Progression Note (Addendum)
Transition of Care [facility]) - Progression Note    Patient Details  Name: [name] MRN: [id number] Date of Birth: 03/07/1930  Transition of Care [facility]) CM/SW [place], LCSW Phone Number: 11/25/2019, 10:01 AM  Clinical Narrative:  [facility] are still talking about plan for this patient's care with the pleurex drain.   11:37 am: Received call from [facility] representative stating that everything has been worked out for [facility] to accept patient back. CSW left message for Elisha [place], RN to confirm. Faxed discharge summary and FL2 to [facility] to review. Nurse is aware we will have to send 2-3 days worth of pleurex supplies until [facility] can come see her. Patient has to have a BM before she can discharge.  2:41 pm: [facility] is ready to accept patient back today once she has had a BM. Faxed signed FL2 to facility. Patient's niece has been updated.  Expected Discharge Plan: Assisted Living Barriers to Discharge: Continued Medical Work up  Expected Discharge Plan and Services Expected Discharge Plan: Assisted Living       Living arrangements for the past 2 months: [place] Expected Discharge Date: 11/25/19                                     Social Determinants of Health (SDOH) Interventions    Readmission Risk Interventions No flowsheet data found.

## 2019-12-02 ENCOUNTER — Telehealth: Payer: Self-pay | Admitting: Oncology

## 2019-12-02 NOTE — Telephone Encounter (Signed)
Writer spoke with patient's niece, Trinda Pascal, on this date and rescheduled patient's appt on 12-08-19 to 12-11-19 to help with room maintenance. Niece stated that she would phone [facility] to inform and to arrange transportation.

## 2019-12-05 ENCOUNTER — Other Ambulatory Visit: Payer: Self-pay

## 2019-12-05 DIAGNOSIS — J9383 Other pneumothorax: Secondary | ICD-10-CM

## 2019-12-08 ENCOUNTER — Other Ambulatory Visit: Payer: Self-pay

## 2019-12-08 ENCOUNTER — Encounter: Payer: Self-pay | Admitting: Cardiothoracic Surgery

## 2019-12-08 ENCOUNTER — Ambulatory Visit
Admission: RE | Admit: 2019-12-08 | Discharge: 2019-12-08 | Disposition: A | Discharge status: Home / Self Care | Payer: Medicare Other | Attending: Cardiothoracic Surgery | Admitting: Cardiothoracic Surgery

## 2019-12-08 ENCOUNTER — Ambulatory Visit
Admission: RE | Admit: 2019-12-08 | Discharge: 2019-12-08 | Disposition: A | Discharge status: Home / Self Care | Payer: Medicare Other | Source: Ambulatory Visit | Attending: Cardiothoracic Surgery | Admitting: Cardiothoracic Surgery

## 2019-12-08 ENCOUNTER — Ambulatory Visit: Payer: Federal, State, Local not specified - PPO | Admitting: Oncology

## 2019-12-08 ENCOUNTER — Inpatient Hospital Stay: Admit: 2019-12-08 | Payer: Medicare Other

## 2019-12-08 ENCOUNTER — Ambulatory Visit (INDEPENDENT_AMBULATORY_CARE_PROVIDER_SITE_OTHER): Payer: Medicare Other | Admitting: Cardiothoracic Surgery

## 2019-12-08 VITALS — BP 129/84 | HR 91 | Temp 98.0°F | Resp 15 | Ht 66.0 in | Wt 115.4 lb

## 2019-12-08 DIAGNOSIS — J9383 Other pneumothorax: Secondary | ICD-10-CM | POA: Diagnosis not present

## 2019-12-08 DIAGNOSIS — J910 Malignant pleural effusion: Secondary | ICD-10-CM

## 2019-12-08 NOTE — Progress Notes (Signed)
Savannah Galvan returns today in follow-up.  It has been 2 weeks since her Pleurx catheter was inserted.  Her niece drained at one time and obtained 1000 cc.  Last week it was drained twice a week and only 5 cc were obtained.  Today we drained in the office and only got 5 cc as well.  On exam the catheter and the catheter site all look fine.  There is no erythema or drainage.  She does have decreased breath sounds at both bases.  We will go ahead and get a chest x-ray today.  If there is no evidence of any fluid on the chest x-ray we will consider removing the catheter.

## 2019-12-11 ENCOUNTER — Telehealth: Payer: Self-pay | Admitting: Pharmacist

## 2019-12-11 ENCOUNTER — Telehealth: Payer: Self-pay | Admitting: Pharmacy Technician

## 2019-12-11 ENCOUNTER — Other Ambulatory Visit: Payer: Self-pay

## 2019-12-11 ENCOUNTER — Inpatient Hospital Stay: Payer: Medicare Other | Attending: Oncology | Admitting: Oncology

## 2019-12-11 ENCOUNTER — Other Ambulatory Visit: Payer: Self-pay | Admitting: *Deleted

## 2019-12-11 ENCOUNTER — Encounter: Payer: Self-pay | Admitting: Oncology

## 2019-12-11 VITALS — BP 99/67 | HR 72 | Temp 99.1°F | Resp 16 | Ht 66.0 in | Wt 117.2 lb

## 2019-12-11 DIAGNOSIS — C771 Secondary and unspecified malignant neoplasm of intrathoracic lymph nodes: Secondary | ICD-10-CM | POA: Diagnosis not present

## 2019-12-11 DIAGNOSIS — Z7189 Other specified counseling: Secondary | ICD-10-CM

## 2019-12-11 DIAGNOSIS — R0602 Shortness of breath: Secondary | ICD-10-CM | POA: Diagnosis not present

## 2019-12-11 DIAGNOSIS — R600 Localized edema: Secondary | ICD-10-CM | POA: Insufficient documentation

## 2019-12-11 DIAGNOSIS — Z7901 Long term (current) use of anticoagulants: Secondary | ICD-10-CM | POA: Insufficient documentation

## 2019-12-11 DIAGNOSIS — C50919 Malignant neoplasm of unspecified site of unspecified female breast: Secondary | ICD-10-CM

## 2019-12-11 DIAGNOSIS — J910 Malignant pleural effusion: Secondary | ICD-10-CM | POA: Insufficient documentation

## 2019-12-11 DIAGNOSIS — F0390 Unspecified dementia without behavioral disturbance: Secondary | ICD-10-CM | POA: Insufficient documentation

## 2019-12-11 DIAGNOSIS — Z170 Estrogen receptor positive status [ER+]: Secondary | ICD-10-CM | POA: Diagnosis not present

## 2019-12-11 DIAGNOSIS — E039 Hypothyroidism, unspecified: Secondary | ICD-10-CM | POA: Insufficient documentation

## 2019-12-11 DIAGNOSIS — I82621 Acute embolism and thrombosis of deep veins of right upper extremity: Secondary | ICD-10-CM | POA: Diagnosis not present

## 2019-12-11 DIAGNOSIS — Z885 Allergy status to narcotic agent status: Secondary | ICD-10-CM | POA: Diagnosis not present

## 2019-12-11 DIAGNOSIS — I700 Atherosclerosis of aorta: Secondary | ICD-10-CM | POA: Diagnosis not present

## 2019-12-11 DIAGNOSIS — Z79811 Long term (current) use of aromatase inhibitors: Secondary | ICD-10-CM | POA: Diagnosis not present

## 2019-12-11 DIAGNOSIS — C50911 Malignant neoplasm of unspecified site of right female breast: Secondary | ICD-10-CM | POA: Diagnosis present

## 2019-12-11 DIAGNOSIS — Z79899 Other long term (current) drug therapy: Secondary | ICD-10-CM | POA: Diagnosis not present

## 2019-12-11 DIAGNOSIS — J948 Other specified pleural conditions: Secondary | ICD-10-CM | POA: Insufficient documentation

## 2019-12-11 DIAGNOSIS — I129 Hypertensive chronic kidney disease with stage 1 through stage 4 chronic kidney disease, or unspecified chronic kidney disease: Secondary | ICD-10-CM | POA: Diagnosis not present

## 2019-12-11 DIAGNOSIS — R234 Changes in skin texture: Secondary | ICD-10-CM | POA: Insufficient documentation

## 2019-12-11 DIAGNOSIS — J9811 Atelectasis: Secondary | ICD-10-CM | POA: Insufficient documentation

## 2019-12-11 DIAGNOSIS — I82B11 Acute embolism and thrombosis of right subclavian vein: Secondary | ICD-10-CM | POA: Diagnosis not present

## 2019-12-11 DIAGNOSIS — J939 Pneumothorax, unspecified: Secondary | ICD-10-CM | POA: Insufficient documentation

## 2019-12-11 MED ORDER — LETROZOLE 2.5 MG PO TABS: 2.5000 mg | ORAL_TABLET | Freq: Every day | ORAL | 3 refills | Status: DC

## 2019-12-11 NOTE — Telephone Encounter (Signed)
Oral Oncology Patient Advocate Encounter  Received notification from [place] that prior authorization for [facility] is required.  PA submitted on CoverMyMeds Key [id number]  Status is pending  Oral Oncology Clinic will continue to follow.  [place] Patient [place] Phone [phone] Fax [phone] 12/11/2019 4:34 PM

## 2019-12-11 NOTE — Progress Notes (Signed)
Pt eats when she likes the food, she does drink 1-2 boost a day when she does not eat as well. Per sister she has not lost wt but not gained wt. She drinks 1 glass of wine with dinner each night

## 2019-12-11 NOTE — Telephone Encounter (Signed)
Oral Chemotherapy Pharmacy Student Encounter  Assessment Received new prescription for Ibrance (palbociclib) for the treatment of stage IV ER/PR+, HER2- breast cancer in conjunction with letrozole, planned duration until disease progression or unacceptable drug toxicity.  CMP and CBC from 11/25/2019 assessed, Ca slightly low at 8.7 otherwise unremarkable. Prescription sent for benefits investigation. Plan to start on reduced dose of 100 mg daily.   Current medication list in Epic reviewed, no DDIs with Ibrance identified.  Evaluated chart and no patient barriers to medication adherence identified.   Prescription has been sent in for benefits investigation and approval - Bethena Roys to follow-up on this.  Oral Oncology Clinic will continue to follow for insurance authorization, copayment issues, and start date.   Patient Education I spoke with patient for overview of new oral chemotherapy medication: Ibrance (palbociclib) for the treatment of stage IV ER/PR+, HER2- breast cancer in conjunction with letrozole, planned duration until disease progression or unacceptable drug toxicity.   Pt is doing well. Counseled patient on administration, dosing, side effects, monitoring, drug-food interactions, safe handling, storage, and disposal. Prescription will be for 1 tablet (100 mg total) by mouth daily for 21 days, followed by 7 days off. Repeat every 28 days.   Side effects include but not limited to: fatigue, hair thinning, constipation, diarrhea, N/V, low white count.    Reviewed with patient importance of keeping a medication schedule and plan for any missed doses. Not an issue as patient has medications handled by her assisted living facility.  After discussion with patient no patient barriers to medication adherence identified.   Savannah Galvan voiced understanding and appreciation. All questions answered.  Provided patient with Oral [facility] phone number. Patient knows to  call the office with questions or concerns. Oral Chemotherapy Navigation Clinic will continue to follow.   Laurey Arrow, PharmD Candidate 2022 [facility]/HP/AP Oral Chemotherapy Navigation Clinic [phone]  12/11/2019 3:59 PM

## 2019-12-11 NOTE — Telephone Encounter (Signed)
Oral Oncology Patient Advocate Encounter  Prior Authorization for [facility] has been approved.    PA# [id number] Effective dates: 11/11/19 through 12/10/20  Patient must use [facility] for lower copay.  Oral Oncology Clinic will continue to follow.   [place] Patient [place] Phone [phone] Fax [phone] 12/12/2019 8:42 AM

## 2019-12-12 ENCOUNTER — Telehealth: Payer: Self-pay | Admitting: *Deleted

## 2019-12-12 ENCOUNTER — Encounter: Payer: Self-pay | Admitting: Emergency Medicine

## 2019-12-12 ENCOUNTER — Other Ambulatory Visit: Payer: Self-pay | Admitting: Emergency Medicine

## 2019-12-12 ENCOUNTER — Encounter: Payer: Medicare Other | Admitting: Cardiothoracic Surgery

## 2019-12-12 DIAGNOSIS — J9383 Other pneumothorax: Secondary | ICD-10-CM

## 2019-12-12 NOTE — Telephone Encounter (Signed)
Patients daughter called and stated that she was to call to report her drainage output. Patients daughter stated that it has not drained at all from Wednesday to today. Please call and advise

## 2019-12-12 NOTE — Telephone Encounter (Signed)
Called Mehgan back. Per Meghan, she states drainage output was 5cc or less, and none since then. No after visit summary nor f/u appt noted in chart. Called place to Dr Genevive Bi at this time.   Per Dr Genevive Bi, patient scheduled to come in on Monday 12/15/19 at 9am. Chest x-ray to be done PRIOR. Pt is to Stop Eliquis today and resume Tuesday 12/16/19. Order has been placed. Medication order has been faxed to [facility] at this time.   Meghan made aware and voiced understanding. No further concerns.

## 2019-12-15 ENCOUNTER — Ambulatory Visit
Admission: RE | Admit: 2019-12-15 | Discharge: 2019-12-15 | Disposition: A | Discharge status: Home / Self Care | Payer: Medicare Other | Source: Ambulatory Visit | Attending: Cardiothoracic Surgery | Admitting: Cardiothoracic Surgery

## 2019-12-15 ENCOUNTER — Encounter: Payer: Self-pay | Admitting: Cardiothoracic Surgery

## 2019-12-15 ENCOUNTER — Other Ambulatory Visit: Payer: Self-pay

## 2019-12-15 ENCOUNTER — Ambulatory Visit
Admission: RE | Admit: 2019-12-15 | Discharge: 2019-12-15 | Disposition: A | Discharge status: Home / Self Care | Payer: Medicare Other | Attending: Cardiothoracic Surgery | Admitting: Cardiothoracic Surgery

## 2019-12-15 ENCOUNTER — Ambulatory Visit (INDEPENDENT_AMBULATORY_CARE_PROVIDER_SITE_OTHER): Payer: Medicare Other | Admitting: Cardiothoracic Surgery

## 2019-12-15 VITALS — BP 123/85 | HR 90 | Temp 97.7°F | Ht 66.0 in | Wt 115.0 lb

## 2019-12-15 DIAGNOSIS — J9383 Other pneumothorax: Secondary | ICD-10-CM | POA: Insufficient documentation

## 2019-12-15 DIAGNOSIS — J910 Malignant pleural effusion: Secondary | ICD-10-CM

## 2019-12-15 NOTE — Patient Instructions (Addendum)
Go ahead and restart your Plavix today.   We drained 840 cc of fluid from you lungs today. We will change therapy and only have you do your draining 1 time a week.  We will see you as needed.   Call us if the catheter stops draining and we will see you. You will need to have a chest x-ray prior.  Thank you

## 2019-12-15 NOTE — Progress Notes (Signed)
Hematology/Oncology Consult note [facility]  [phone] [phone]  Patient Care Team: Clide Deutscher, [place] as PCP - General (General Practice)   Name of the patient: Savannah Galvan  [id number]  Sep 02, 1929   Date of visit: 12/15/19  Diagnosis- metastatic ER positive breast cancer with metastases to mediastinal lymph nodes, bilateral pleural effusion, pectoralis mass causing right upper extremity DVT  Chief complaint/ Reason for visit-routine follow-up of breast cancer  Heme/Onc history:  Patient is a 86 year old female with a past medical history significant for hypertension hypothyroidism dementia CKD and history of breast cancer. She has a history of right upper extremity lymphedema and was noted to have significant right upper extremity swelling since April 2021. Sheunderwent right upper extremity/axillary ultrasound which showed DVT involving the right subclavian and basilic veins. Echogenic density noted in the right axilla 3.8 cm.She has been referred for further management.Labs from June 2021 revealed normal CMP. CBC at that time was also unremarkable.  Patient lives at an assisted living in [place] and has some dementia and does not remember all of her history. Her healthcare power of attorney who is her grandniece is with her today. Patient does not have any children of her own. She has 2 other sisters were also involved in her healthcare and one of them lives in [place]. Patient has a history of right breast cancer back in 2008 s/p bilateral mastectomy. She did not receive any adjuvant chemotherapy or radiation. She went on to take 5 years of tamoxifen. We do not have her prior medical history and medication list for [place] to review at the time of our visit.  PET scan showed a confluent soft tissue thickening involving the right pectoralis muscle, overlying nodular skin thickening both of which showed hypermetabolism. Small  mediastinal lymph nodes showing marked hypermetabolism and large right and moderate left pleural effusion with bibasilar collapse/consolidative opacity.  Right-sided thoracentesis was positive for metastatic breast cancer ER/PR positive and HER-2 negative    Interval history-patient has not started taking the letrozole yet as there was some mixup with her pharmacy.  She has also not started palliative radiation treatment yet.  She did undergo Pleurx catheter placement by Dr. Genevive Bi which helped initially But presently it is not draining much.  She will have repeat follow-up with Dr. Genevive Bi to decide if the Pleurx catheter needs to stay in place or not.   ECOG PS- 1 Pain scale- 0 Opioid associated constipation- no  Review of systems- Review of Systems  Constitutional: Negative for chills, fever, malaise/fatigue and weight loss.  HENT: Negative for congestion, ear discharge and nosebleeds.   Eyes: Negative for blurred vision.  Respiratory: Negative for cough, hemoptysis, sputum production, shortness of breath and wheezing.   Cardiovascular: Negative for chest pain, palpitations, orthopnea and claudication.  Gastrointestinal: Negative for abdominal pain, blood in stool, constipation, diarrhea, heartburn, melena, nausea and vomiting.  Genitourinary: Negative for dysuria, flank pain, frequency, hematuria and urgency.  Musculoskeletal: Negative for back pain, joint pain and myalgias.       Right upper extremity swelling  Skin: Negative for rash.  Neurological: Negative for dizziness, tingling, focal weakness, seizures, weakness and headaches.  Endo/Heme/Allergies: Does not bruise/bleed easily.  Psychiatric/Behavioral: Negative for depression and suicidal ideas. The patient does not have insomnia.       Allergies  Allergen Reactions  . Demerol [Meperidine] Nausea And Vomiting     Past Medical History:  Diagnosis Date  . Breast cancer ([place])   .  Chronic kidney disease (CKD), stage II  (mild)   . Cyst of pancreas   . Hyperlipidemia, unspecified   . Hypertension   . Hypothyroidism, unspecified   . Nonrheumatic mitral (valve) insufficiency   . Obstructive sleep apnea (adult) (pediatric)      Past Surgical History:  Procedure Laterality Date  . CHEST TUBE INSERTION Right 11/24/2019   Procedure: CHEST TUBE INSERTION PLEURX;  Surgeon: Nestor Lewandowsky, MD;  Location: [facility] ORS;  Service: General;  Laterality: Right;    Social History   Socioeconomic History  . Marital status: Widowed    Spouse name: Not on file  . Number of children: Not on file  . Years of education: Not on file  . Highest education level: Not on file  Occupational History  . Not on file  Tobacco Use  . Smoking status: Never Smoker  . Smokeless tobacco: Never Used  Vaping Use  . Vaping Use: Never used  Substance and Sexual Activity  . Alcohol use: Yes    Alcohol/week: 7.0 standard drinks    Types: 7 Glasses of wine per week  . Drug use: Never  . Sexual activity: Not Currently  Other Topics Concern  . Not on file  Social History Narrative  . Not on file   Social Determinants of Health   Financial Resource Strain:   . Difficulty of Paying Living Expenses: Not on file  Food Insecurity:   . Worried About Charity fundraiser in the Last Year: Not on file  . Ran Out of Food in the Last Year: Not on file  Transportation Needs:   . Lack of Transportation (Medical): Not on file  . Lack of Transportation (Non-Medical): Not on file  Physical Activity:   . Days of Exercise per Week: Not on file  . Minutes of Exercise per Session: Not on file  Stress:   . Feeling of Stress : Not on file  Social Connections:   . Frequency of Communication with Friends and Family: Not on file  . Frequency of Social Gatherings with Friends and Family: Not on file  . Attends Religious Services: Not on file  . Active Member of Clubs or Organizations: Not on file  . Attends Archivist Meetings: Not on  file  . Marital Status: Not on file  Intimate Partner Violence:   . Fear of Current or Ex-Partner: Not on file  . Emotionally Abused: Not on file  . Physically Abused: Not on file  . Sexually Abused: Not on file    History reviewed. No pertinent family history.   Current Outpatient Medications:  .  ELIQUIS 5 MG TABS tablet, Take 5 mg by mouth 2 (two) times daily. , Disp: , Rfl:  .  hydrochlorothiazide (HYDRODIURIL) 25 MG tablet, Take 25 mg by mouth daily as needed (for edema)., Disp: , Rfl:  .  losartan (COZAAR) 50 MG tablet, Take 50 mg by mouth 2 (two) times daily., Disp: , Rfl:  .  mirtazapine (REMERON) 7.5 MG tablet, Take by mouth., Disp: , Rfl:  .  Multiple Vitamin (MULTIVITAMIN) tablet, Take 1 tablet by mouth daily. , Disp: , Rfl:  .  MYRBETRIQ 50 MG TB24 tablet, Take 50 mg by mouth daily., Disp: , Rfl:  .  Nutritional Supplements (NUTRITIONAL SHAKE PLUS PROTEIN PO), Take by mouth daily., Disp: , Rfl:  .  vitamin B-12 (CYANOCOBALAMIN) 1000 MCG tablet, Take 1,000 mcg by mouth daily., Disp: , Rfl:  .  letrozole (FEMARA) 2.5 MG tablet, Take  1 tablet (2.5 mg total) by mouth daily., Disp: 30 tablet, Rfl: 3  Physical exam:  Vitals:   12/11/19 1451 12/11/19 1452  BP: 99/67 99/67  Pulse:  72  Resp:  16  Temp:  99.1 F (37.3 C)  TempSrc:  Oral  Weight: 117 lb 3.2 oz (53.2 kg)   Height: $Remove'5\' 6"'NjfHyov$  (1.676 m)    Physical Exam Cardiovascular:     Rate and Rhythm: Normal rate and regular rhythm.     Heart sounds: Normal heart sounds.  Pulmonary:     Effort: Pulmonary effort is normal.     Comments: Breath sounds decreased over right lung base Abdominal:     General: Bowel sounds are normal.     Palpations: Abdomen is soft.  Skin:    General: Skin is warm and dry.     Comments: Multiple skin nodules seen over the medial aspect of the right shoulder.  Right upper extremity appears swollen  Neurological:     Mental Status: She is alert.     Comments: Oriented to self and place        CMP Latest Ref Rng & Units 11/25/2019  Glucose 70 - 99 mg/dL 110(H)  BUN 8 - 23 mg/dL 17  Creatinine 0.44 - 1.00 mg/dL 0.63  Sodium 135 - 145 mmol/L 134(L)  Potassium 3.5 - 5.1 mmol/L 4.2  Chloride 98 - 111 mmol/L 101  CO2 22 - 32 mmol/L 26  Calcium 8.9 - 10.3 mg/dL 8.7(L)  Total Protein 6.5 - 8.1 g/dL -  Total Bilirubin 0.3 - 1.2 mg/dL -  Alkaline Phos 38 - 126 U/L -  AST 15 - 41 U/L -  ALT 0 - 44 U/L -   CBC Latest Ref Rng & Units 11/25/2019  WBC 4.0 - 10.5 K/uL 7.8  Hemoglobin 12.0 - 15.0 g/dL 12.9  Hematocrit 36 - 46 % 38.2  Platelets 150 - 400 K/uL 296    No images are attached to the encounter.  DG Chest 2 View  Result Date: 12/15/2019 CLINICAL DATA:  Pneumothorax. EXAM: CHEST - 2 VIEW COMPARISON:  December 08, 2019. FINDINGS: Stable cardiomediastinal silhouette. Stable position of right-sided chest tube. No definite pneumothorax is noted. Stable moderate size bilateral pleural effusions are noted with underlying atelectasis. Bony thorax is unremarkable. IMPRESSION: Stable position of right-sided chest tube. Stable moderate size bilateral pleural effusions with underlying atelectasis. No definite pneumothorax is noted. Electronically Signed   By: Marijo Conception M.D.   On: 12/15/2019 08:42   DG Chest 2 View  Result Date: 12/08/2019 CLINICAL DATA:  Follow-up pneumothorax. EXAM: CHEST - 2 VIEW COMPARISON:  11/25/2019 FINDINGS: A right-sided tunneled pleural catheter remains in place with its tip now positioned posteriorly in the mid hemithorax (previously coiled in the lung base). Aortic atherosclerosis is noted. The cardiac silhouette is partially obscured but grossly normal in size. There are moderate bilateral pleural effusions which have increased in size, particularly on the right. There is associated atelectasis in the lung bases. No residual pneumothorax is identified. Surgical clips are noted along the right lateral chest wall/axilla. IMPRESSION: 1. Interval enlargement  of moderate bilateral pleural effusions with change in positioning of the right tunneled pleural catheter. 2. No residual pneumothorax. Electronically Signed   By: Logan Bores M.D.   On: 12/08/2019 16:54   DG Chest 2 View  Result Date: 11/19/2019 CLINICAL DATA:  Shortness of breath EXAM: CHEST - 2 VIEW COMPARISON:  11/11/2019 FINDINGS: Small to moderate bilateral pleural effusions, increased  on the right. Superimposed pleural air on the right, which is also present on the prior study though now more apical rather than basilar is. Bibasilar atelectasis. Normal heart size. No acute osseous abnormality. IMPRESSION: Moderate right hydropneumothorax. Similar small to moderate left pleural effusion. Associated bilateral atelectasis. Electronically Signed   By: Macy Mis M.D.   On: 11/19/2019 09:17   DG Chest Port 1 View  Result Date: 11/25/2019 CLINICAL DATA:  Postoperative evaluation.  Right chest tube. EXAM: PORTABLE CHEST 1 VIEW COMPARISON:  11/24/2019. FINDINGS: Right chest tube again noted with tip now noted over the right lower chest. Persistent but improved right-sided pneumothorax. Heart size stable. Bilateral pulmonary infiltrates/edema noted on today's exam. Bibasilar atelectasis. Bilateral pleural effusions again noted. Surgical clips right chest. IMPRESSION: 1. Right chest tube again noted with tip now noted over the right lower chest. Persistent but improved right-sided pneumothorax. 2. Bilateral pulmonary infiltrates/edema noted on today's exam. Bibasilar atelectasis. Bilateral pleural effusions again noted. Electronically Signed   By: Marcello Moores  Register   On: 11/25/2019 07:44   DG Chest Port 1 View  Result Date: 11/24/2019 CLINICAL DATA:  Chest tube placement. EXAM: PORTABLE CHEST 1 VIEW COMPARISON:  Same day. FINDINGS: Stable cardiomediastinal silhouette. Right pleural drainage catheter appears to been repositioned with tip directed more superiorly. Mild right apical pneumothorax is noted  which is significantly decreased compared to prior exam. Decreased right pleural effusion is noted. Stable left pleural effusion is noted. Bony thorax is unremarkable. IMPRESSION: Right pleural drainage catheter appears to been repositioned with tip directed more superiorly. Mild right apical pneumothorax is noted which is significantly decreased compared to prior exam. Decreased right pleural effusion is noted. Stable left pleural effusion. Aortic Atherosclerosis (ICD10-I70.0). Electronically Signed   By: Marijo Conception M.D.   On: 11/24/2019 13:53   DG Chest Port 1 View  Result Date: 11/24/2019 CLINICAL DATA:  Status post chest tube placement. EXAM: PORTABLE CHEST 1 VIEW COMPARISON:  11/20/2019 FINDINGS: Interval progression of right pneumothorax, now moderate in size. Right pleural drain overlies the lower right hemithorax. Persistent bibasilar collapse/consolidative opacity with bilateral pleural effusions. Cardiopericardial silhouette is at upper limits of normal for size. The visualized bony structures of the thorax show now acute abnormality. Telemetry leads overlie the chest. IMPRESSION: 1. Interval progression of right pneumothorax, now moderate in size. Right pleural drain overlies the lower right hemithorax. 2. Persistent bibasilar collapse/consolidative opacity with bilateral pleural effusions. Electronically Signed   By: Misty Stanley M.D.   On: 11/24/2019 11:24   DG Chest Port 1 View  Result Date: 11/20/2019 CLINICAL DATA:  Shortness of breath.  Chest pain EXAM: PORTABLE CHEST 1 VIEW COMPARISON:  11/19/2019. FINDINGS: Stable moderate right-sided pneumothorax. Heart size stable. Prominent bilateral pulmonary infiltrates/edema. Progressive right pleural effusion. Stable left pleural effusion. CHF could present in this fashion. Bilateral pneumonia cannot be excluded. Persistent bibasilar atelectasis. Surgical clips right chest. No acute bony abnormality. IMPRESSION: 1.  Stable moderate right-sided  pneumothorax. 2. Prominent bilateral pulmonary infiltrates/edema. Progressive right pleural effusion. Stable left pleural effusion. CHF could present in this fashion. Bilateral pneumonia cannot be excluded. 3.  Persistent bibasilar atelectasis. Electronically Signed   By: Marcello Moores  Register   On: 11/20/2019 07:13     Assessment and plan- Patient is a 86 y.o. female with metastatic ER positive breast cancer with malignant pleural effusion, mediastinal adenopathy as well as involvement of the right pectoralis muscle and skin involvement  Metastatic breast cancer: We have sent letrozole prescription to [facility]  in her nursing home will start giving it to her at this time.  She is to continue letrozole along with radiation.  I did reach out to Dr. Baruch Gouty as well and he plans to give her palliative radiation to the right pectoralis area for 10 treatments.  After she finishes radiation treatment I would like her to start on Ibrance.  Given her age I will start at 100 mg 3 weeks on 1 week off but if the dose is not tolerated I will lower it down to 75 mg.  Discussed risks and benefits of Ibrance including all but not limited to diarrhea, low blood counts including neutropenia, mucositis.  I have discussed this in detail with her grandniece Megan as well.  If she has issues tolerating Ibrance we will stop it.  However patient has reasonable disease burden including mediastinal adenopathy pectoralis mass and malignant pleural effusion.  Letrozole alone may not be effective in controlling the disease.  Patient's grandniece who is the patient's healthcare proxy understands and is willing to give [facility] a try for the patient.  I will tentatively see her back in 1 month's time and start Ibrance at that time when she completes radiation.  I will check CBC with differential and CMP at that time   Malignant pleural effusion: She has a Pleurx catheter in place and will follow up with Dr. Genevive Bi to decide if it can  remain versus come out Visit Diagnosis 1. Metastatic breast cancer ([place])   2. Use of letrozole (Femara)   3. Goals of care, counseling/discussion      Dr. Randa Evens, MD, MPH [facility] [phone] 12/15/2019 8:54 AM

## 2019-12-15 NOTE — Progress Notes (Signed)
Savannah Galvan Follow Up Note  Patient ID: Savannah Galvan, female   DOB: 22-Apr-1929, 86 y.o.   MRN: [id number]  HISTORY: She presents today in follow-up.  She did complain of more shortness of breath today than usual but she thinks this is because she has been rushed this morning to get here on time.  She did have her chest x-ray made.  I have independently reviewed that.  There does appear to be more fluid on the right side than before.  She states that there has been no drainage from the chest tube over the last week.    Vitals:   12/15/19 0905  BP: 123/85  Pulse: 90  Temp: 97.7 F (36.5 C)  SpO2: 96%     EXAM:  Resp: Lungs are diminished bilaterally.  No respiratory distress, normal effort. Heart:  Regular without murmurs Abd:  Abdomen is soft, non distended and non tender. No masses are palpable.  There is no rebound and no guarding.  Neurological: Alert and oriented to person, place, and time. Coordination normal.  Skin: Skin is warm and dry. No rash noted. No diaphoretic. No erythema. No pallor.  Psychiatric: Normal mood and affect. Normal behavior. Judgment and thought content normal.   The Pleurx catheter site is clean dry and intact.  There is no drainage.  There is no erythema.   ASSESSMENT: Increasing right-sided pleural effusion   PLAN:   We did drain the Pleurx catheter today.  I obtained 850 cc of straw-colored fluid.  Sterile dressings were reapplied.  I have recommended that she continue to undergo weekly Pleurx catheter drainage.  She should contact us if there is any further problems.    Nestor Lewandowsky, MD

## 2019-12-17 ENCOUNTER — Ambulatory Visit: Payer: Medicare Other

## 2019-12-17 MED ORDER — PALBOCICLIB 100 MG PO TABS: 100.0000 mg | ORAL_TABLET | Freq: Every day | ORAL | 0 refills | Status: DC

## 2019-12-17 NOTE — Telephone Encounter (Signed)
Rx sent to [facility].  Supportive information faxed.

## 2019-12-19 ENCOUNTER — Encounter: Payer: Self-pay | Admitting: Emergency Medicine

## 2019-12-19 ENCOUNTER — Inpatient Hospital Stay
Admission: EM | Admit: 2019-12-19 | Discharge: 2019-12-22 | DRG: 871 | Disposition: A | Discharge status: Hospice | Payer: Medicare Other | Attending: Internal Medicine | Admitting: Internal Medicine

## 2019-12-19 ENCOUNTER — Emergency Department: Payer: Medicare Other

## 2019-12-19 ENCOUNTER — Inpatient Hospital Stay: Payer: Medicare Other

## 2019-12-19 ENCOUNTER — Other Ambulatory Visit: Payer: Self-pay

## 2019-12-19 DIAGNOSIS — I82721 Chronic embolism and thrombosis of deep veins of right upper extremity: Secondary | ICD-10-CM | POA: Diagnosis not present

## 2019-12-19 DIAGNOSIS — F32A Depression, unspecified: Secondary | ICD-10-CM | POA: Diagnosis present

## 2019-12-19 DIAGNOSIS — Z79811 Long term (current) use of aromatase inhibitors: Secondary | ICD-10-CM

## 2019-12-19 DIAGNOSIS — J9602 Acute respiratory failure with hypercapnia: Secondary | ICD-10-CM | POA: Diagnosis not present

## 2019-12-19 DIAGNOSIS — A419 Sepsis, unspecified organism: Principal | ICD-10-CM | POA: Diagnosis present

## 2019-12-19 DIAGNOSIS — R6521 Severe sepsis with septic shock: Secondary | ICD-10-CM | POA: Diagnosis present

## 2019-12-19 DIAGNOSIS — Y95 Nosocomial condition: Secondary | ICD-10-CM | POA: Diagnosis present

## 2019-12-19 DIAGNOSIS — J189 Pneumonia, unspecified organism: Secondary | ICD-10-CM

## 2019-12-19 DIAGNOSIS — I130 Hypertensive heart and chronic kidney disease with heart failure and stage 1 through stage 4 chronic kidney disease, or unspecified chronic kidney disease: Secondary | ICD-10-CM | POA: Diagnosis present

## 2019-12-19 DIAGNOSIS — G4733 Obstructive sleep apnea (adult) (pediatric): Secondary | ICD-10-CM | POA: Diagnosis present

## 2019-12-19 DIAGNOSIS — R0602 Shortness of breath: Secondary | ICD-10-CM | POA: Diagnosis not present

## 2019-12-19 DIAGNOSIS — J948 Other specified pleural conditions: Secondary | ICD-10-CM

## 2019-12-19 DIAGNOSIS — C50919 Malignant neoplasm of unspecified site of unspecified female breast: Secondary | ICD-10-CM

## 2019-12-19 DIAGNOSIS — Z515 Encounter for palliative care: Secondary | ICD-10-CM | POA: Diagnosis not present

## 2019-12-19 DIAGNOSIS — Z9013 Acquired absence of bilateral breasts and nipples: Secondary | ICD-10-CM

## 2019-12-19 DIAGNOSIS — Z66 Do not resuscitate: Secondary | ICD-10-CM | POA: Diagnosis present

## 2019-12-19 DIAGNOSIS — K862 Cyst of pancreas: Secondary | ICD-10-CM | POA: Diagnosis present

## 2019-12-19 DIAGNOSIS — F329 Major depressive disorder, single episode, unspecified: Secondary | ICD-10-CM | POA: Diagnosis present

## 2019-12-19 DIAGNOSIS — N182 Chronic kidney disease, stage 2 (mild): Secondary | ICD-10-CM | POA: Diagnosis present

## 2019-12-19 DIAGNOSIS — Z853 Personal history of malignant neoplasm of breast: Secondary | ICD-10-CM | POA: Diagnosis not present

## 2019-12-19 DIAGNOSIS — J910 Malignant pleural effusion: Secondary | ICD-10-CM | POA: Diagnosis present

## 2019-12-19 DIAGNOSIS — Z20822 Contact with and (suspected) exposure to covid-19: Secondary | ICD-10-CM | POA: Diagnosis present

## 2019-12-19 DIAGNOSIS — Z885 Allergy status to narcotic agent status: Secondary | ICD-10-CM

## 2019-12-19 DIAGNOSIS — I340 Nonrheumatic mitral (valve) insufficiency: Secondary | ICD-10-CM | POA: Diagnosis present

## 2019-12-19 DIAGNOSIS — Z681 Body mass index (BMI) 19 or less, adult: Secondary | ICD-10-CM

## 2019-12-19 DIAGNOSIS — E872 Acidosis: Secondary | ICD-10-CM | POA: Diagnosis present

## 2019-12-19 DIAGNOSIS — R0600 Dyspnea, unspecified: Secondary | ICD-10-CM

## 2019-12-19 DIAGNOSIS — I10 Essential (primary) hypertension: Secondary | ICD-10-CM | POA: Diagnosis present

## 2019-12-19 DIAGNOSIS — J90 Pleural effusion, not elsewhere classified: Secondary | ICD-10-CM | POA: Diagnosis not present

## 2019-12-19 DIAGNOSIS — Z803 Family history of malignant neoplasm of breast: Secondary | ICD-10-CM

## 2019-12-19 DIAGNOSIS — Z79899 Other long term (current) drug therapy: Secondary | ICD-10-CM

## 2019-12-19 DIAGNOSIS — R627 Adult failure to thrive: Secondary | ICD-10-CM | POA: Diagnosis present

## 2019-12-19 DIAGNOSIS — E785 Hyperlipidemia, unspecified: Secondary | ICD-10-CM | POA: Diagnosis present

## 2019-12-19 DIAGNOSIS — Z86718 Personal history of other venous thrombosis and embolism: Secondary | ICD-10-CM

## 2019-12-19 DIAGNOSIS — C799 Secondary malignant neoplasm of unspecified site: Secondary | ICD-10-CM | POA: Diagnosis present

## 2019-12-19 DIAGNOSIS — I82621 Acute embolism and thrombosis of deep veins of right upper extremity: Secondary | ICD-10-CM | POA: Diagnosis present

## 2019-12-19 DIAGNOSIS — J9601 Acute respiratory failure with hypoxia: Secondary | ICD-10-CM | POA: Diagnosis present

## 2019-12-19 DIAGNOSIS — E039 Hypothyroidism, unspecified: Secondary | ICD-10-CM | POA: Diagnosis present

## 2019-12-19 DIAGNOSIS — R64 Cachexia: Secondary | ICD-10-CM | POA: Diagnosis present

## 2019-12-19 DIAGNOSIS — I248 Other forms of acute ischemic heart disease: Secondary | ICD-10-CM

## 2019-12-19 DIAGNOSIS — L8990 Pressure ulcer of unspecified site, unspecified stage: Secondary | ICD-10-CM | POA: Insufficient documentation

## 2019-12-19 DIAGNOSIS — C7981 Secondary malignant neoplasm of breast: Secondary | ICD-10-CM | POA: Diagnosis not present

## 2019-12-19 DIAGNOSIS — R778 Other specified abnormalities of plasma proteins: Secondary | ICD-10-CM | POA: Diagnosis not present

## 2019-12-19 DIAGNOSIS — I509 Heart failure, unspecified: Secondary | ICD-10-CM

## 2019-12-19 DIAGNOSIS — Z170 Estrogen receptor positive status [ER+]: Secondary | ICD-10-CM

## 2019-12-19 DIAGNOSIS — R7989 Other specified abnormal findings of blood chemistry: Secondary | ICD-10-CM | POA: Diagnosis present

## 2019-12-19 DIAGNOSIS — Z7901 Long term (current) use of anticoagulants: Secondary | ICD-10-CM

## 2019-12-19 DIAGNOSIS — F0390 Unspecified dementia without behavioral disturbance: Secondary | ICD-10-CM | POA: Diagnosis present

## 2019-12-19 DIAGNOSIS — M7989 Other specified soft tissue disorders: Secondary | ICD-10-CM | POA: Diagnosis present

## 2019-12-19 LAB — RESPIRATORY PANEL BY RT PCR (FLU A&B, COVID)
Influenza A by PCR: NEGATIVE
Influenza B by PCR: NEGATIVE
SARS Coronavirus 2 by RT PCR: NEGATIVE

## 2019-12-19 LAB — CBC WITH DIFFERENTIAL/PLATELET
Abs Immature Granulocytes: 0.06 10*3/uL (ref 0.00–0.07)
Basophils Absolute: 0.1 10*3/uL (ref 0.0–0.1)
Basophils Relative: 1 %
Eosinophils Absolute: 0.4 10*3/uL (ref 0.0–0.5)
Eosinophils Relative: 3 %
HCT: 49.7 % — ABNORMAL HIGH (ref 36.0–46.0)
Hemoglobin: 15.7 g/dL — ABNORMAL HIGH (ref 12.0–15.0)
Immature Granulocytes: 1 %
Lymphocytes Relative: 39 %
Lymphs Abs: 5 10*3/uL — ABNORMAL HIGH (ref 0.7–4.0)
MCH: 30.9 pg (ref 26.0–34.0)
MCHC: 31.6 g/dL (ref 30.0–36.0)
MCV: 97.8 fL (ref 80.0–100.0)
Monocytes Absolute: 0.8 10*3/uL (ref 0.1–1.0)
Monocytes Relative: 6 %
Neutro Abs: 6.5 10*3/uL (ref 1.7–7.7)
Neutrophils Relative %: 50 %
Platelets: 353 10*3/uL (ref 150–400)
RBC: 5.08 MIL/uL (ref 3.87–5.11)
RDW: 12.9 % (ref 11.5–15.5)
WBC: 12.8 10*3/uL — ABNORMAL HIGH (ref 4.0–10.5)
nRBC: 0 % (ref 0.0–0.2)

## 2019-12-19 LAB — COMPREHENSIVE METABOLIC PANEL
ALT: 30 U/L (ref 0–44)
AST: 36 U/L (ref 15–41)
Albumin: 3.1 g/dL — ABNORMAL LOW (ref 3.5–5.0)
Alkaline Phosphatase: 79 U/L (ref 38–126)
Anion gap: 15 (ref 5–15)
BUN: 10 mg/dL (ref 8–23)
CO2: 22 mmol/L (ref 22–32)
Calcium: 9.2 mg/dL (ref 8.9–10.3)
Chloride: 103 mmol/L (ref 98–111)
Creatinine, Ser: 0.91 mg/dL (ref 0.44–1.00)
GFR calc Af Amer: >60 mL/min (ref >60)
GFR calc non Af Amer: 57 mL/min — ABNORMAL LOW (ref >60)
Glucose, Bld: 266 mg/dL — ABNORMAL HIGH (ref 70–99)
Potassium: 3.8 mmol/L (ref 3.5–5.1)
Sodium: 140 mmol/L (ref 135–145)
Total Bilirubin: 0.6 mg/dL (ref 0.3–1.2)
Total Protein: 6.5 g/dL (ref 6.5–8.1)

## 2019-12-19 LAB — PROCALCITONIN: Procalcitonin: <0.1 ng/mL

## 2019-12-19 LAB — MRSA PCR SCREENING: MRSA by PCR: NEGATIVE

## 2019-12-19 LAB — LACTIC ACID, PLASMA
Lactic Acid, Venous: 4.7 mmol/L (ref 0.5–1.9)
Lactic Acid, Venous: 3.2 mmol/L (ref 0.5–1.9)
Lactic Acid, Venous: 4.1 mmol/L (ref 0.5–1.9)

## 2019-12-19 LAB — TROPONIN I (HIGH SENSITIVITY)
Troponin I (High Sensitivity): 154 ng/L (ref <18)
Troponin I (High Sensitivity): 497 ng/L (ref <18)
Troponin I (High Sensitivity): 622 ng/L (ref <18)
Troponin I (High Sensitivity): 814 ng/L (ref <18)

## 2019-12-19 LAB — PROTIME-INR
INR: 1.2 (ref 0.8–1.2)
Prothrombin Time: 15.1 seconds (ref 11.4–15.2)

## 2019-12-19 LAB — LACTATE DEHYDROGENASE: LDH: 254 U/L — ABNORMAL HIGH (ref 98–192)

## 2019-12-19 LAB — PROTEIN, TOTAL: Total Protein: 6.7 g/dL (ref 6.5–8.1)

## 2019-12-19 LAB — APTT: aPTT: 29 seconds (ref 24–36)

## 2019-12-19 LAB — BRAIN NATRIURETIC PEPTIDE: B Natriuretic Peptide: 126.3 pg/mL — ABNORMAL HIGH (ref 0.0–100.0)

## 2019-12-19 MED ORDER — ADULT MULTIVITAMIN W/MINERALS CH
1.0000 | ORAL_TABLET | Freq: Every day | ORAL | Status: DC
  Administered 2019-12-21: 1 via ORAL
  Filled 2019-12-19 (×2): qty 1

## 2019-12-19 MED ORDER — HYDRALAZINE HCL 20 MG/ML IJ SOLN: 5.0000 mg | INTRAMUSCULAR | Status: DC | PRN

## 2019-12-19 MED ORDER — IPRATROPIUM-ALBUTEROL 0.5-2.5 (3) MG/3ML IN SOLN
3.0000 mL | Freq: Once | RESPIRATORY_TRACT | Status: AC
  Filled 2019-12-19: qty 3

## 2019-12-19 MED ORDER — VANCOMYCIN HCL 1250 MG/250ML IV SOLN
1250.0000 mg | Freq: Once | INTRAVENOUS | Status: AC
  Administered 2019-12-19: 1250 mg via INTRAVENOUS
  Filled 2019-12-19: qty 250

## 2019-12-19 MED ORDER — SODIUM CHLORIDE 0.9 % IV BOLUS
500.0000 mL | Freq: Once | INTRAVENOUS | Status: AC
  Administered 2019-12-19: 500 mL via INTRAVENOUS

## 2019-12-19 MED ORDER — LOSARTAN POTASSIUM 50 MG PO TABS
50.0000 mg | ORAL_TABLET | Freq: Every day | ORAL | Status: DC
  Administered 2019-12-20 – 2019-12-21 (×2): 50 mg via ORAL
  Filled 2019-12-19 (×2): qty 1

## 2019-12-19 MED ORDER — ALBUTEROL SULFATE (2.5 MG/3ML) 0.083% IN NEBU: 2.5000 mg | INHALATION_SOLUTION | RESPIRATORY_TRACT | Status: DC | PRN

## 2019-12-19 MED ORDER — LORAZEPAM 2 MG/ML IJ SOLN
INTRAMUSCULAR | Status: AC
  Administered 2019-12-19: 1 mg via INTRAVENOUS
  Filled 2019-12-19: qty 1

## 2019-12-19 MED ORDER — ONDANSETRON HCL 4 MG/2ML IJ SOLN
4.0000 mg | Freq: Three times a day (TID) | INTRAMUSCULAR | Status: DC | PRN
  Administered 2019-12-21: 4 mg via INTRAVENOUS
  Filled 2019-12-19: qty 2

## 2019-12-19 MED ORDER — LORAZEPAM 2 MG/ML IJ SOLN: 1.0000 mg | Freq: Once | INTRAMUSCULAR | Status: AC

## 2019-12-19 MED ORDER — BOOST HIGH PROTEIN PO LIQD
1.0000 | Freq: Every day | ORAL | Status: DC
  Administered 2019-12-20 – 2019-12-21 (×2): 237 mL via ORAL
  Filled 2019-12-19: qty 237

## 2019-12-19 MED ORDER — VITAMIN B-12 1000 MCG PO TABS
1000.0000 ug | ORAL_TABLET | Freq: Every day | ORAL | Status: DC
  Administered 2019-12-21: 1000 ug via ORAL
  Filled 2019-12-19 (×2): qty 1

## 2019-12-19 MED ORDER — LETROZOLE 2.5 MG PO TABS
2.5000 mg | ORAL_TABLET | Freq: Every day | ORAL | Status: DC
  Filled 2019-12-19: qty 1

## 2019-12-19 MED ORDER — IPRATROPIUM-ALBUTEROL 0.5-2.5 (3) MG/3ML IN SOLN
3.0000 mL | RESPIRATORY_TRACT | Status: DC
  Administered 2019-12-19 – 2019-12-20 (×5): 3 mL via RESPIRATORY_TRACT
  Filled 2019-12-19 (×5): qty 3

## 2019-12-19 MED ORDER — FUROSEMIDE 10 MG/ML IJ SOLN
20.0000 mg | Freq: Once | INTRAMUSCULAR | Status: DC
  Filled 2019-12-19: qty 4

## 2019-12-19 MED ORDER — ASPIRIN 81 MG PO CHEW: 324.0000 mg | CHEWABLE_TABLET | Freq: Once | ORAL | Status: DC

## 2019-12-19 MED ORDER — MAGNESIUM SULFATE 2 GM/50ML IV SOLN
2.0000 g | Freq: Once | INTRAVENOUS | Status: AC
  Administered 2019-12-19: 2 g via INTRAVENOUS
  Filled 2019-12-19: qty 50

## 2019-12-19 MED ORDER — MIRTAZAPINE 15 MG PO TABS
7.5000 mg | ORAL_TABLET | Freq: Every day | ORAL | Status: DC
  Administered 2019-12-19 – 2019-12-21 (×3): 7.5 mg via ORAL
  Filled 2019-12-19 (×3): qty 1

## 2019-12-19 MED ORDER — ACETAMINOPHEN 325 MG PO TABS: 650.0000 mg | ORAL_TABLET | Freq: Four times a day (QID) | ORAL | Status: DC | PRN

## 2019-12-19 MED ORDER — SODIUM CHLORIDE 0.9 % IV SOLN
2.0000 g | Freq: Once | INTRAVENOUS | Status: AC
  Administered 2019-12-19: 2 g via INTRAVENOUS
  Filled 2019-12-19: qty 2

## 2019-12-19 MED ORDER — SODIUM CHLORIDE 0.9 % IV SOLN: INTRAVENOUS | Status: DC

## 2019-12-19 MED ORDER — METHYLPREDNISOLONE SODIUM SUCC 125 MG IJ SOLR
125.0000 mg | Freq: Once | INTRAMUSCULAR | Status: AC
  Administered 2019-12-19: 125 mg via INTRAVENOUS
  Filled 2019-12-19: qty 2

## 2019-12-19 MED ORDER — MIRABEGRON ER 50 MG PO TB24
50.0000 mg | ORAL_TABLET | Freq: Every day | ORAL | Status: DC
  Administered 2019-12-20 – 2019-12-22 (×3): 50 mg via ORAL
  Filled 2019-12-19 (×3): qty 1

## 2019-12-19 MED ORDER — IPRATROPIUM-ALBUTEROL 0.5-2.5 (3) MG/3ML IN SOLN
3.0000 mL | Freq: Once | RESPIRATORY_TRACT | Status: AC
  Administered 2019-12-19: 3 mL via RESPIRATORY_TRACT

## 2019-12-19 MED ORDER — DM-GUAIFENESIN ER 30-600 MG PO TB12
1.0000 | ORAL_TABLET | Freq: Two times a day (BID) | ORAL | Status: DC | PRN
  Administered 2019-12-20: 1 via ORAL
  Filled 2019-12-19: qty 1

## 2019-12-19 MED ORDER — SODIUM CHLORIDE (PF) 0.9 % IJ SOLN
Freq: Once | INTRAMUSCULAR | Status: DC
  Filled 2019-12-19: qty 5

## 2019-12-19 MED ORDER — APIXABAN 5 MG PO TABS
5.0000 mg | ORAL_TABLET | Freq: Two times a day (BID) | ORAL | Status: DC
  Administered 2019-12-19 – 2019-12-21 (×4): 5 mg via ORAL
  Filled 2019-12-19 (×4): qty 1

## 2019-12-19 MED ORDER — VANCOMYCIN HCL 750 MG/150ML IV SOLN
750.0000 mg | INTRAVENOUS | Status: DC
  Filled 2019-12-19: qty 150

## 2019-12-19 MED ORDER — IPRATROPIUM-ALBUTEROL 0.5-2.5 (3) MG/3ML IN SOLN
RESPIRATORY_TRACT | Status: AC
  Administered 2019-12-19: 3 mL via RESPIRATORY_TRACT
  Filled 2019-12-19: qty 3

## 2019-12-19 MED ORDER — SODIUM CHLORIDE 0.9 % IV SOLN
2.0000 g | Freq: Two times a day (BID) | INTRAVENOUS | Status: DC
  Administered 2019-12-19 – 2019-12-22 (×6): 2 g via INTRAVENOUS
  Filled 2019-12-19 (×8): qty 2

## 2019-12-19 NOTE — Consult Note (Signed)
Patient ID: Savannah Galvan, female   DOB: [birth year], 86 y.o.   MRN: [id number]  HPI Savannah Galvan is a 86 y.o. female seen in consultation at the request of Dr. Blaine Hamper for recurrent bilateral malignant pleural effusions.  Patient with a history of metastatic breast cancer and history of bilateral mastectomy 2008., she had a Pleurx catheter placed by Dr. Genevive Bi on November 24, 2019 and was recently seen in his office 5 days ago where 450 cc of fluid was drawn.  She now comes with failure to thrive and increase in shortness of breath with oxygen desaturation.  Her oxygenation responded with oxygen and currently is on BiPAP.  She is having significant respiratory issues.  History is taken from the chart and nursing notes secondary to respiratory condition.  Please note that the patient is DNR and does have a history of dementia. She lives in assisted living facility. INR 1.2 , ptt nml, hb 15.7 pl 353.  She did have significant increase in troponin up to 497, ABG shows significant acidosis.  BNP was also elevated I have personally reviewed this chest x-ray and compared to the previous side there is significant bilateral pleural effusions given the technique is difficult to compare them side to side and obtain reliable results.  There is evidence of the Pleurx catheter in place. She is anticoagulated  HPI  Past Medical History:  Diagnosis Date  . Breast cancer ([place])   . Chronic kidney disease (CKD), stage II (mild)   . Cyst of pancreas   . Hyperlipidemia, unspecified   . Hypertension   . Hypothyroidism, unspecified   . Nonrheumatic mitral (valve) insufficiency   . Obstructive sleep apnea (adult) (pediatric)     Past Surgical History:  Procedure Laterality Date  . BREAST SURGERY    . CHEST TUBE INSERTION Right 11/24/2019   Procedure: CHEST TUBE INSERTION PLEURX;  Surgeon: Nestor Lewandowsky, MD;  Location: [facility] ORS;  Service: General;  Laterality: Right;    Family History  Problem Relation Age of  Onset  . Breast cancer Sister   . Mesothelioma Sister     Social History Social History   Tobacco Use  . Smoking status: Never Smoker  . Smokeless tobacco: Never Used  Vaping Use  . Vaping Use: Never used  Substance Use Topics  . Alcohol use: Yes    Alcohol/week: 7.0 standard drinks    Types: 7 Glasses of wine per week  . Drug use: Never    Allergies  Allergen Reactions  . Demerol [Meperidine] Nausea And Vomiting    Current Facility-Administered Medications  Medication Dose Route Frequency Provider Last Rate Last Admin  . 0.9 %  sodium chloride infusion   Intravenous Continuous Ivor Costa, MD 75 mL/hr at 12/19/19 1339 New Bag at 12/19/19 1339  . acetaminophen (TYLENOL) tablet 650 mg  650 mg Oral Q6H PRN Ivor Costa, MD      . albuterol (PROVENTIL) (2.5 MG/3ML) 0.083% nebulizer solution 2.5 mg  2.5 mg Nebulization Q4H PRN Ivor Costa, MD      . apixaban [facility]) tablet 5 mg  5 mg Oral BID Ivor Costa, MD      . ceFEPIme (MAXIPIME) 2 g in sodium chloride 0.9 % 100 mL IVPB  2 g Intravenous Q12H Rowland Lathe, RPH      . dextromethorphan-guaiFENesin (MUCINEX DM) 30-600 MG per 12 hr tablet 1 tablet  1 tablet Oral BID PRN Ivor Costa, MD      . hydrALAZINE (APRESOLINE) injection 5  mg  5 mg Intravenous Q2H PRN Ivor Costa, MD      . ipratropium-albuterol (DUONEB) 0.5-2.5 (3) MG/3ML nebulizer solution 3 mL  3 mL Nebulization Q4H Ivor Costa, MD   3 mL at 12/19/19 1212  . [START ON 12/20/2019] letrozole [facility]) tablet 2.5 mg  2.5 mg Oral Daily Ivor Costa, MD      . Derrill Memo ON 12/20/2019] losartan (COZAAR) tablet 50 mg  50 mg Oral Daily Ivor Costa, MD      . Derrill Memo ON 12/20/2019] mirabegron ER (MYRBETRIQ) tablet 50 mg  50 mg Oral Daily Ivor Costa, MD      . mirtazapine (REMERON) tablet 7.5 mg  7.5 mg Oral QHS Ivor Costa, MD      . Derrill Memo ON 12/20/2019] multivitamin tablet 1 tablet  1 tablet Oral Daily Ivor Costa, MD      . Derrill Memo ON 12/20/2019] Nutritional Shake Plus Protein LIQD 1 each  1  each Oral Daily Ivor Costa, MD      . ondansetron [facility]) injection 4 mg  4 mg Intravenous Q8H PRN Ivor Costa, MD      . Derrill Memo ON 12/20/2019] vancomycin (VANCOREADY) IVPB 750 mg/150 mL  750 mg Intravenous Q24H Rowland Lathe, RPH      . [START ON 12/20/2019] vitamin B-12 (CYANOCOBALAMIN) tablet 1,000 mcg  1,000 mcg Oral Daily Ivor Costa, MD       Current Outpatient Medications  Medication Sig Dispense Refill  . ELIQUIS 5 MG TABS tablet Take 5 mg by mouth 2 (two) times daily.     Marland Kitchen letrozole (FEMARA) 2.5 MG tablet Take 1 tablet (2.5 mg total) by mouth daily. 30 tablet 3  . losartan (COZAAR) 50 MG tablet Take 50 mg by mouth daily.     . mirtazapine (REMERON) 7.5 MG tablet Take by mouth.    . Multiple Vitamin (MULTIVITAMIN) tablet Take 1 tablet by mouth daily.     Marland Kitchen MYRBETRIQ 50 MG TB24 tablet Take 50 mg by mouth daily.    . Nutritional Supplements (NUTRITIONAL SHAKE PLUS PROTEIN PO) Take 1 each by mouth daily.     . vitamin B-12 (CYANOCOBALAMIN) 1000 MCG tablet Take 1,000 mcg by mouth daily.    . palbociclib (IBRANCE) 100 MG tablet Take 1 tablet (100 mg total) by mouth daily. Take for 21 days on, 7 days off, repeat every 28 days. (Patient not taking: Reported on 12/19/2019) 21 tablet 0     Review of Systems ROS unable to obtained due to mentation and resp condition.  Physical Exam Blood pressure (!) 120/91, pulse (!) 105, temperature 97.6 F (36.4 C), temperature source Axillary, resp. rate 19, weight 54.6 kg, SpO2 100 %. CONSTITUTIONAL: Debilitated on Bipap, Cachectic EYES: Pupils are equal, round, and reactive to light, Sclera are non-icteric. EARS, NOSE, MOUTH AND THROAT: wearing a mask. LYMPH NODES:  Lymph nodes in the neck are normal. RESPIRATORY:  Lungs with decreased sounds bilaterally, tachypneic, right PLEURX catheter in place w serous fluid within lumen, I tried to emptied but did not obtain any fluid CARDIOVASCULAR: Heart is regular without murmurs, gallops, or rubs. GI: The  abdomen is  soft, nontender, and nondistended. There are no palpable masses. There is no hepatosplenomegaly. There are normal bowel sounds in all quadrants. MUSCULOSKELETAL:Rigth upper extremity edema from Chronic lymphedema Right upper ext. SKIN: Turgor is good and there are no pathologic skin lesions or ulcers. PSYCH:  Opt on bipap, follows commands but not able to carry a full conversation  Data Reviewed  I have personally reviewed the patient's imaging, laboratory findings and medical records.    Assessment/Plan Recurrent bilateral malignant pleural effusion in the setting of metastatic breast cancer on a demented patient cachectic and with poor performance status. She has significant acidosis that obviously is not explain by the pleural effusion.  I am not sure if she may have also had diffusion / shunt issue such as a PE that is contributing to her respiratory failure. . I do think the main question will be goals of treatment.  She is definitely towards her end of her life.  I will obtain a CT scan of the chest to see if there is more loculations associated to this or potentially some other pathology that might be causing obstruction of the Pleurx catheter.  I will continue to reevaluate and then think about potentially doing TPA via the right chest.  Again I do think that the most important conversation that primary team has to have is goals of care, she might benefit from palliative medicine. We will continue to follow. D/W Dr. Rockey Situ and Dr. Shelba Flake, MD FACS General Surgeon 12/19/2019, 3:46 PM

## 2019-12-19 NOTE — Consult Note (Signed)
Cardiology Consultation:   Patient ID: Savannah Galvan MRN: [id number]; DOB: 1929/10/21  Admit date: 12/19/2019 Date of Consult: 12/19/2019  Primary Care Provider: Clide Deutscher, [facility] Cardiologist: New to [facility] Physician requesting consult: Dr. Blaine Hamper Reason for consult: Shortness of breath, elevated troponin   Patient Profile:   Savannah Galvan is a 86 y.o. female with a hx of dementia, cachectic, recurrent bilateral malignant pleural effusions, metastatic breast cancer, history of bilateral mastectomy 2008, has bilateral Pleurx catheters November 24, 2019, presenting from assisted living facility with worsening shortness of breath, elevated troponin  History of Present Illness:   Ms. Mines presented by EMS for respiratory distress On route had DuoNeb treatment, facemask Started on BiPAP Notes indicating 65% on room air when EMS arrived, nonrebreather up to 85% Hypertensive on arrival 175/105 with heart rate 111 BNP 126  Thoracentesis 4 days ago 850 cc out per the notes from Dr. Faith Rogue Chest x-ray with moderate to large bilateral pleural effusions Attempt to use Pleurx drainage system by surgery on the right unsuccessful  CT scan with large bilateral pleural effusions, compressive atelectasis  Past Medical History:  Diagnosis Date  . Breast cancer ([place])   . Chronic kidney disease (CKD), stage II (mild)   . Cyst of pancreas   . Hyperlipidemia, unspecified   . Hypertension   . Hypothyroidism, unspecified   . Nonrheumatic mitral (valve) insufficiency   . Obstructive sleep apnea (adult) (pediatric)     Past Surgical History:  Procedure Laterality Date  . BREAST SURGERY    . CHEST TUBE INSERTION Right 11/24/2019   Procedure: CHEST TUBE INSERTION PLEURX;  Surgeon: Nestor Lewandowsky, MD;  Location: [facility] ORS;  Service: General;  Laterality: Right;     Home Medications:  Prior to Admission medications   Medication Sig Start Date End Date Taking? Authorizing  Provider  ELIQUIS 5 MG TABS tablet Take 5 mg by mouth 2 (two) times daily.  09/23/19  Yes [provider]  letrozole (FEMARA) 2.5 MG tablet Take 1 tablet (2.5 mg total) by mouth daily. 12/11/19  Yes Sindy Guadeloupe, MD  losartan (COZAAR) 50 MG tablet Take 50 mg by mouth daily.  09/30/19  Yes [provider]  mirtazapine (REMERON) 7.5 MG tablet Take by mouth.   Yes [provider]  Multiple Vitamin (MULTIVITAMIN) tablet Take 1 tablet by mouth daily.  07/21/19  Yes Kathryne Hitch, RN  MYRBETRIQ 50 MG TB24 tablet Take 50 mg by mouth daily. 09/22/19  Yes [provider]  Nutritional Supplements (NUTRITIONAL SHAKE PLUS PROTEIN PO) Take 1 each by mouth daily.    Yes Kathryne Hitch, RN  vitamin B-12 (CYANOCOBALAMIN) 1000 MCG tablet Take 1,000 mcg by mouth daily. 07/21/19  Yes Kathryne Hitch, RN  palbociclib (IBRANCE) 100 MG tablet Take 1 tablet (100 mg total) by mouth daily. Take for 21 days on, 7 days off, repeat every 28 days. Patient not taking: Reported on 12/19/2019 12/17/19   Sindy Guadeloupe, MD    Inpatient Medications: Scheduled Meds: . apixaban  5 mg Oral BID  . ipratropium-albuterol  3 mL Nebulization Q4H  . [START ON 12/20/2019] letrozole  2.5 mg Oral Daily  . [START ON 12/20/2019] losartan  50 mg Oral Daily  . [START ON 12/20/2019] mirabegron ER  50 mg Oral Daily  . mirtazapine  7.5 mg Oral QHS  . [START ON 12/20/2019] multivitamin with minerals  1 tablet Oral Daily  . [START ON 12/20/2019] Nutritional Shake Plus  Protein  1 each Oral Daily  . [START ON 12/20/2019] vitamin B-12  1,000 mcg Oral Daily   Continuous Infusions: . sodium chloride 75 mL/hr at 12/19/19 1339  . ceFEPime (MAXIPIME) IV    . [START ON 12/20/2019] vancomycin     PRN Meds: acetaminophen, albuterol, dextromethorphan-guaiFENesin, hydrALAZINE, ondansetron (ZOFRAN) IV  Allergies:    Allergies  Allergen Reactions  . Demerol [Meperidine] Nausea And Vomiting    Social History:     Social History   Socioeconomic History  . Marital status: Widowed    Spouse name: Not on file  . Number of children: Not on file  . Years of education: Not on file  . Highest education level: Not on file  Occupational History  . Not on file  Tobacco Use  . Smoking status: Never Smoker  . Smokeless tobacco: Never Used  Vaping Use  . Vaping Use: Never used  Substance and Sexual Activity  . Alcohol use: Yes    Alcohol/week: 7.0 standard drinks    Types: 7 Glasses of wine per week  . Drug use: Never  . Sexual activity: Not Currently  Other Topics Concern  . Not on file  Social History Narrative  . Not on file   Social Determinants of Health   Financial Resource Strain:   . Difficulty of Paying Living Expenses: Not on file  Food Insecurity:   . Worried About Charity fundraiser in the Last Year: Not on file  . Ran Out of Food in the Last Year: Not on file  Transportation Needs:   . Lack of Transportation (Medical): Not on file  . Lack of Transportation (Non-Medical): Not on file  Physical Activity:   . Days of Exercise per Week: Not on file  . Minutes of Exercise per Session: Not on file  Stress:   . Feeling of Stress : Not on file  Social Connections:   . Frequency of Communication with Friends and Family: Not on file  . Frequency of Social Gatherings with Friends and Family: Not on file  . Attends Religious Services: Not on file  . Active Member of Clubs or Organizations: Not on file  . Attends Archivist Meetings: Not on file  . Marital Status: Not on file  Intimate Partner Violence:   . Fear of Current or Ex-Partner: Not on file  . Emotionally Abused: Not on file  . Physically Abused: Not on file  . Sexually Abused: Not on file    Family History:    Family History  Problem Relation Age of Onset  . Breast cancer Sister   . Mesothelioma Sister      ROS:  Please see the history of present illness.  Review of Systems  Constitutional: Negative.    HENT: Negative.   Respiratory: Positive for shortness of breath.   Cardiovascular: Negative.   Gastrointestinal: Negative.   Musculoskeletal: Negative.   Neurological: Negative.   Psychiatric/Behavioral: Negative.   All other systems reviewed and are negative.    Physical Exam/Data:   Vitals:   12/19/19 1528 12/19/19 1530 12/19/19 1545 12/19/19 1600  BP:  131/75 124/70 126/75  Pulse: (!) 105     Resp:  (!) 25 (!) 21 (!) 24  Temp:      TempSrc:      SpO2: 100%     Weight:        Intake/Output Summary (Last 24 hours) at 12/19/2019 1702 Last data filed at 12/19/2019 1312 Gross per 24 hour  Intake 144.45 ml  Output --  Net 144.45 ml   Last 3 Weights 12/19/2019 12/15/2019 12/11/2019  Weight (lbs) 120 lb 4.8 oz 115 lb 117 lb 3.2 oz  Weight (kg) 54.568 kg 52.164 kg 53.162 kg     Body mass index is 19.42 kg/m.  General: Cachectic, thin, respiratory distress, BiPAP in place HEENT: normal Lymph: no adenopathy Neck: Unable to estimate JVD Endocrine:  No thryomegaly Vascular: No carotid bruits; FA pulses 2+ bilaterally without bruits  Cardiac:  normal S1, S2; RRR; no murmur  Lungs: Decreased breath sounds, dullness at the bases Abd: soft, nontender, no hepatomegaly  Ext: no edema Musculoskeletal:  No deformities, BUE and BLE strength normal and equal Skin: warm and dry  Neuro: Full neurological exam not performed Psych: Confused  EKG:  The EKG was personally reviewed and demonstrates:   Significant baseline artifact though appears to be normal sinus rhythm/sinus tachycardia rate 118 bpm unable to exclude old anterior MI Telemetry:  Telemetry was personally reviewed and demonstrates:  Normal sinus rhythm  Relevant CV Studies: CT scan chest Large right pleural effusion with PleurX catheter in place, tip in the posterior pleural space. No extrapleural air/pneumothorax. Complete compressive atelectasis of the right lower lobe with moderate atelectasis of the right upper  lobe. 2. Large left pleural effusion with near complete compressive atelectasis of the left lower lobe and moderate compressive atelectasis of the right upper and middle lobes. 3. Right chest wall skin thickening and subcutaneous edema. Right axillary skin thickening is partially included. 4. Small pulmonary nodules in the right lung, largest measuring 6 mm in the right middle lobe. These appear new from prior PET, suspicious for metastasis. The previous lingular nodule is not definitively seen.  Laboratory Data:  High Sensitivity Troponin:   Recent Labs  Lab 12/19/19 0954 12/19/19 1151 12/19/19 1525  TROPONINIHS 154* 497* 622*     Chemistry Recent Labs  Lab 12/19/19 0954  NA 140  K 3.8  CL 103  CO2 22  GLUCOSE 266*  BUN 10  CREATININE 0.91  CALCIUM 9.2  GFRNONAA 57*  GFRAA >60  ANIONGAP 15    Recent Labs  Lab 12/19/19 0954 12/19/19 1316  PROT 6.5 6.7  ALBUMIN 3.1*  --   AST 36  --   ALT 30  --   ALKPHOS 79  --   BILITOT 0.6  --    Hematology Recent Labs  Lab 12/19/19 0954  WBC 12.8*  RBC 5.08  HGB 15.7*  HCT 49.7*  MCV 97.8  MCH 30.9  MCHC 31.6  RDW 12.9  PLT 353   BNP Recent Labs  Lab 12/19/19 0954  BNP 126.3*    DDimer No results for input(s): DDIMER in the last 168 hours.   Radiology/Studies:  DG Chest Portable 1 View  Result Date: 12/19/2019 CLINICAL DATA:  Shortness of breath EXAM: PORTABLE CHEST 1 VIEW COMPARISON:  12/15/2019, 12/08/2019 FINDINGS: Moderate to large bilateral pleural effusions, which appears slightly increased from prior. Right basilar pleural drainage catheter remains in place. Patchy bibasilar opacities, progressed. No pneumothorax. Cardiac silhouette is obscured. Aortic atherosclerosis. IMPRESSION: 1. Moderate to large bilateral pleural effusions, slightly increased from prior. 2. Patchy bibasilar opacities which may reflect atelectasis and/or pneumonia, progressed from prior. Electronically Signed   By: Davina Poke D.O.   On: 12/19/2019 10:07   {  Assessment and Plan:   Acute respiratory distress Secondary to large bilateral pleural effusions, malignant In the setting of breast cancer Significant compressive atelectasis  noted on CT -Followed by Dr. Faith Rogue In the emergency room unable to pull fluid from the Pleurx catheter on right.  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 Donella Stade, consideration of radiation/palliative  Breast cancer Recent right-sided thoracentesis was positive for metastatic breast cancer ER/PR positive and HER-2 negative  Bilateral pleural effusions It would appear per the notes that minimal drainage has been obtained since September 20 Attempt today unsuccessful on the right Surgery following   For questions or updates, please contact [facility] Please consult [email or web address] for contact info under    Signed, Ida Rogue, MD  12/19/2019 5:02 PM

## 2019-12-19 NOTE — ED Notes (Signed)
Attempted to drain pleural catheter using PleurX drainage system. Could see droplets of fluid moving within the drainage tubing, but otherwise no output. Patient's sister reports she thinks home health tried to drain it yesterday and also didn't get any fluid off. MD notified via secure chat. New sterile cap and new dressing placed.

## 2019-12-19 NOTE — ED Notes (Signed)
PleurX supplies requested from Supply Chain.

## 2019-12-19 NOTE — ED Notes (Signed)
Repeat VBG in 30 minutes per Dr. Cinda Quest

## 2019-12-19 NOTE — ED Triage Notes (Signed)
Pt to ED via [facility] emergency traffic from [facility] for respiratory distress. Pt arrive getting duoneb treatment via facemask. Pt is status post mastectomy with significant swelling to the right arm. Pt has increased work of breathing. EDP and respiratory at bedside. Pt to be started on Bipap.   Pt initial SpO2 65% on room air when EMS arrived. Pt was placed on NRB by EMS and pt sats came up to 85%.

## 2019-12-19 NOTE — ED Notes (Signed)
Provider from surgery at bedside to replace pleurX drainage system.

## 2019-12-19 NOTE — Progress Notes (Signed)
Called sister to give update on Patient.   Let her know the room number she is in.   Sister will be in tomorrow

## 2019-12-19 NOTE — ED Notes (Signed)
Report given to Georgie RN 

## 2019-12-19 NOTE — H&P (Signed)
History and Physical    Savannah Galvan LGX:[id number] DOB: 1929/04/20 DOA: 12/19/2019  Referring MD/NP/PA:   PCP: Clide Deutscher, PA   Patient coming from:  The patient is coming from ALF.  At baseline, pt is dependent for most of ADL.        Chief Complaint: SOB  HPI: Savannah Galvan is a 86 y.o. female with medical history significant of hypertension, hyperlipidemia, OSA, CKD stage II, breast cancer (s/p of mastectomy), hydropneumothorax (s/p of right chest tube placement), right arm DVT on Eliquis, hypothyroidism, depression, who presents with shortness of breath.  Patient was recently hospitalized from 8/25-8/31 due to right-sided hydropneumothorax after mastectomy. Pt is s/p of right chest tube placement. Pt has shortness of breath in the past several days, which has been progressively worsening.  Patient has dry cough, no chest pain.  No fever or chills.  Does not have nausea, vomiting, diarrhea or abdominal pain no symptoms of UTI.  No unilateral weakness. Patient was found to have oxygen desaturation to 65% on room air, which improved to 99% on BiPAP. Patient has a history of right arm DVT on Eliquis.  She still has right arm swelling.   ED Course: pt was found to have WBC 12.8, troponin I 54, BMP 126, negative COVID-19 PCR, lactic acid 4.7, electrolytes renal function okay, temperature 97.6, blood pressure 123/83, tachycardia with heart rate 113, tachypnea with RR 34.  ABG with pH 7.16, PCO2 73, pending PaO2. Patient is admitted to progressive bed as inpatient.  Chest x-ray showed: 1. Moderate to large bilateral pleural effusions, slightly increased from prior. 2. Patchy bibasilar opacities which may reflect atelectasis and/or pneumonia, progressed from prior.  Review of Systems:   General: no fevers, chills, no body weight gain, has poor appetite, has fatigue HEENT: no blurry vision, hearing changes or sore throat Respiratory: has dyspnea, coughing, no wheezing CV: no chest  pain, no palpitations GI: no nausea, vomiting, abdominal pain, diarrhea, constipation GU: no dysuria, burning on urination, increased urinary frequency, hematuria  Ext: no leg edema Neuro: no unilateral weakness, numbness, or tingling, no vision change or hearing loss Skin: no rash, no skin tear. MSK: No muscle spasm, no deformity, no limitation of range of movement in spin Heme: No easy bruising.  Travel history: No recent long distant travel.  Allergy:  Allergies  Allergen Reactions  . Demerol [Meperidine] Nausea And Vomiting    Past Medical History:  Diagnosis Date  . Breast cancer ([place])   . Chronic kidney disease (CKD), stage II (mild)   . Cyst of pancreas   . Hyperlipidemia, unspecified   . Hypertension   . Hypothyroidism, unspecified   . Nonrheumatic mitral (valve) insufficiency   . Obstructive sleep apnea (adult) (pediatric)     Past Surgical History:  Procedure Laterality Date  . BREAST SURGERY    . CHEST TUBE INSERTION Right 11/24/2019   Procedure: CHEST TUBE INSERTION PLEURX;  Surgeon: Nestor Lewandowsky, MD;  Location: [facility] ORS;  Service: General;  Laterality: Right;    Social History:  reports that she has never smoked. She has never used smokeless tobacco. She reports current alcohol use of about 7.0 standard drinks of alcohol per week. She reports that she does not use drugs.  Family History:  Family History  Problem Relation Age of Onset  . Breast cancer Sister   . Mesothelioma Sister      Prior to Admission medications   Medication Sig Start Date End Date Taking? Authorizing Provider  Arne Cleveland  5 MG TABS tablet Take 5 mg by mouth 2 (two) times daily.  09/23/19  Yes [provider]  letrozole (FEMARA) 2.5 MG tablet Take 1 tablet (2.5 mg total) by mouth daily. 12/11/19  Yes Sindy Guadeloupe, MD  losartan (COZAAR) 50 MG tablet Take 50 mg by mouth daily.  09/30/19  Yes [provider]  mirtazapine (REMERON) 7.5 MG tablet Take by mouth.   Yes [provider]  Multiple Vitamin (MULTIVITAMIN) tablet Take 1 tablet by mouth daily.  07/21/19  Yes Kathryne Hitch, RN  MYRBETRIQ 50 MG TB24 tablet Take 50 mg by mouth daily. 09/22/19  Yes [provider]  Nutritional Supplements (NUTRITIONAL SHAKE PLUS PROTEIN PO) Take 1 each by mouth daily.    Yes Kathryne Hitch, RN  vitamin B-12 (CYANOCOBALAMIN) 1000 MCG tablet Take 1,000 mcg by mouth daily. 07/21/19  Yes Kathryne Hitch, RN  palbociclib (IBRANCE) 100 MG tablet Take 1 tablet (100 mg total) by mouth daily. Take for 21 days on, 7 days off, repeat every 28 days. Patient not taking: Reported on 12/19/2019 12/17/19   Sindy Guadeloupe, MD    Physical Exam: Vitals:   12/19/19 1000 12/19/19 1030 12/19/19 1230 12/19/19 1300  BP: (!) 155/100 123/83 128/78 120/83  Pulse: (!) 113 (!) 103    Resp:  (!) 32 20 (!) 23  Temp:      TempSrc:      SpO2: 100% 99%    Weight:       General: Not in acute distress HEENT:       Eyes: PERRL, EOMI, no scleral icterus.       ENT: No discharge from the ears and nose, no pharynx injection, no tonsillar enlargement.        Neck: No JVD, no bruit, no mass felt. Heme: No neck lymph node enlargement. Cardiac: S1/S2, RRR, No murmurs, No gallops or rubs. Respiratory: Decreased air movement bilaterally.  Left chest tube in place GI: Soft, nondistended, nontender, no rebound pain, no organomegaly, BS present. GU: No hematuria Ext: No pitting leg edema bilaterally. 1+DP/PT pulse bilaterally. Musculoskeletal: No joint deformities, No joint redness or warmth, no limitation of ROM in spin. Skin: No rashes.  Neuro: Alert, oriented X3, cranial nerves II-XII grossly intact, moves all extremities normally.  Psych: Patient is not psychotic, no suicidal or hemocidal ideation.  Labs on Admission: I have personally reviewed following labs and imaging studies  CBC: Recent Labs  Lab 12/19/19 0954  WBC 12.8*  NEUTROABS 6.5  HGB 15.7*  HCT 49.7*  MCV 97.8    PLT 825   Basic Metabolic Panel: Recent Labs  Lab 12/19/19 0954  NA 140  K 3.8  CL 103  CO2 22  GLUCOSE 266*  BUN 10  CREATININE 0.91  CALCIUM 9.2   GFR: Estimated Creatinine Clearance: 37.5 mL/min (by C-G formula based on SCr of 0.91 mg/dL). Liver Function Tests: Recent Labs  Lab 12/19/19 0954 12/19/19 1316  AST 36  --   ALT 30  --   ALKPHOS 79  --   BILITOT 0.6  --   PROT 6.5 6.7  ALBUMIN 3.1*  --    No results for input(s): LIPASE, AMYLASE in the last 168 hours. No results for input(s): AMMONIA in the last 168 hours. Coagulation Profile: Recent Labs  Lab 12/19/19 0954  INR 1.2   Cardiac Enzymes: No results for input(s): CKTOTAL, CKMB, CKMBINDEX, TROPONINI in the last 168 hours. BNP (last 3 results) No  results for input(s): PROBNP in the last 8760 hours. HbA1C: No results for input(s): HGBA1C in the last 72 hours. CBG: No results for input(s): GLUCAP in the last 168 hours. Lipid Profile: No results for input(s): CHOL, HDL, LDLCALC, TRIG, CHOLHDL, LDLDIRECT in the last 72 hours. Thyroid Function Tests: No results for input(s): TSH, T4TOTAL, FREET4, T3FREE, THYROIDAB in the last 72 hours. Anemia Panel: No results for input(s): VITAMINB12, FOLATE, FERRITIN, TIBC, IRON, RETICCTPCT in the last 72 hours. Urine analysis: No results found for: COLORURINE, APPEARANCEUR, LABSPEC, PHURINE, GLUCOSEU, HGBUR, BILIRUBINUR, KETONESUR, PROTEINUR, UROBILINOGEN, NITRITE, LEUKOCYTESUR Sepsis Labs: @LABRCNTIP (procalcitonin:4,lacticidven:4) ) Recent Results (from the past 240 hour(s))  Respiratory Panel by RT PCR (Flu A&B, Covid) - Nasopharyngeal Swab     Status: None   Collection Time: 12/19/19  9:52 AM   Specimen: Nasopharyngeal Swab  Result Value Ref Range Status   SARS Coronavirus 2 by RT PCR NEGATIVE NEGATIVE Final    Comment: (NOTE) SARS-CoV-2 target nucleic acids are NOT DETECTED.  The SARS-CoV-2 RNA is generally detectable in upper respiratoy specimens during  the acute phase of infection. The lowest concentration of SARS-CoV-2 viral copies this assay can detect is 131 copies/mL. A negative result does not preclude SARS-Cov-2 infection and should not be used as the sole basis for treatment or other patient management decisions. A negative result may occur with  improper specimen collection/handling, submission of specimen other than nasopharyngeal swab, presence of viral mutation(s) within the areas targeted by this assay, and inadequate number of viral copies (<131 copies/mL). A negative result must be combined with clinical observations, patient history, and epidemiological information. The expected result is Negative.  Fact Sheet for Patients:  PinkCheek.be  Fact Sheet for Healthcare Providers:  GravelBags.it  This test is no t yet approved or cleared by the [place] FDA and  has been authorized for detection and/or diagnosis of SARS-CoV-2 by FDA under an Emergency Use Authorization (EUA). This EUA will remain  in effect (meaning this test can be used) for the duration of the COVID-19 declaration under Section 564(b)(1) of the Act, 21 U.S.C. section 360bbb-3(b)(1), unless the authorization is terminated or revoked sooner.     Influenza A by PCR NEGATIVE NEGATIVE Final   Influenza B by PCR NEGATIVE NEGATIVE Final    Comment: (NOTE) The Xpert Xpress SARS-CoV-2/FLU/RSV assay is intended as an aid in  the diagnosis of influenza from Nasopharyngeal swab specimens and  should not be used as a sole basis for treatment. Nasal washings and  aspirates are unacceptable for Xpert Xpress SARS-CoV-2/FLU/RSV  testing.  Fact Sheet for Patients: PinkCheek.be  Fact Sheet for Healthcare Providers: GravelBags.it  This test is not yet approved or cleared by the [place] FDA and  has been authorized for detection and/or  diagnosis of SARS-CoV-2 by  FDA under an Emergency Use Authorization (EUA). This EUA will remain  in effect (meaning this test can be used) for the duration of the  Covid-19 declaration under Section 564(b)(1) of the Act, 21  U.S.C. section 360bbb-3(b)(1), unless the authorization is  terminated or revoked. Performed at [facility], [address] [place], [place] [address]      Radiological Exams on Admission: DG Chest Portable 1 View  Result Date: 12/19/2019 CLINICAL DATA:  Shortness of breath EXAM: PORTABLE CHEST 1 VIEW COMPARISON:  12/15/2019, 12/08/2019 FINDINGS: Moderate to large bilateral pleural effusions, which appears slightly increased from prior. Right basilar pleural drainage catheter remains in place. Patchy bibasilar opacities, progressed. No pneumothorax. Cardiac  silhouette is obscured. Aortic atherosclerosis. IMPRESSION: 1. Moderate to large bilateral pleural effusions, slightly increased from prior. 2. Patchy bibasilar opacities which may reflect atelectasis and/or pneumonia, progressed from prior. Electronically Signed   By: Davina Poke D.O.   On: 12/19/2019 10:07     EKG: Independently reviewed.  Sinus rhythm, QTC 498, low voltage, poor quality of EKG strips   Assessment/Plan Principal Problem:   Acute respiratory failure with hypoxia and hypercapnia (HCC) Active Problems:   Metastatic breast cancer (HCC)   Hydropneumothorax   Bilateral pleural effusion   Hypertension   Depression   Elevated troponin   Swelling in right armpit   HCAP (healthcare-associated pneumonia)   Severe sepsis with septic shock (HCC)   Arm DVT (deep venous thromboembolism), chronic, right (HCC)   Acute respiratory failure with hypoxia and hypercapnia ([place]): Likely multifactorial etiology, including bilateral pleural effusion and possible HCAP.  -Admitted to progressive unit as inpatient - BiPAP - IV Vancomycin and cefepime - Mucinex for cough  - Bronchodilators -  Urine legionella and S. pneumococcal antigen - Follow up blood culture x2, sputum culture  Severe sepsis with septic shock due to possible HCAP: Has a severe sepsis with septic shock with leukocytosis with a WBC 12.8, tachycardia with heart rate of 103, tachypnea with RR 34, lactic acid elevated 4.7.  Currently hemodynamically stable -IV antibiotics -will get Procalcitonin and trend lactic acid levels per sepsis protocol. -IVF: 75 cc/h of NS and 500 ml of NS bolus.  Patient has worsening bilateral pleural effusion.  Will not give aggressive IV fluid resuscitation. -IV antibiotics as above -Follow-up blood culture  Metastatic breast cancer [facility]): -Continue letrozole -Follow-up with oncologist -Consult palliative care  Hydropneumothorax and Bilateral pleural effusion: s/p of right sided chest tube. Pt has worsening pleural effusion today. -Consulted general surgeon, Dr. Having  Hypertension -Cozaar -IV hydralazine as needed  Depression -Remeron  Elevated troponin: Troponin I 54, 497, likely due to demand ischemia, but troponin is trending up.  Will consult cardiology, Dr. Rockey Situ. -Will not start aspirin since patient is taking Eliquis -Trend troponin -Check A1c, FLP -Repeat EKG in morning  Swelling in right armpit due to  arm DVT (deep venous thromboembolism), chronic, right (HCC) -Eliquis    DVT ppx: on Eliquis Code Status: DNR per  Her sister Family Communication: Yes, patient's sister  at bed side Disposition Plan:  Anticipate discharge back to previous environment Consults called: Dr. Dahlia Byes of surgery is consulted. Admission status:    progressive unit as inpt      Status is: Inpatient  Remains inpatient appropriate because:Inpatient level of care appropriate due to severity of illness.    Dispo: The patient is from: ALF              Anticipated d/c is to: ALF              Anticipated d/c date is: 2 days              Patient currently is not medically stable to  d/c.           Date of Service 12/19/2019    Ivor Costa [facility]   If 7PM-7AM, please contact night-coverage [email or web address] 12/19/2019, 3:15 PM

## 2019-12-19 NOTE — ED Notes (Signed)
Mimi attempted report to floor. Floor RN said to go ahead and bring pt up. Notified pt will go by CT on way up to floor. Resp therapy to stop by soon to accompany pt to floor.

## 2019-12-19 NOTE — Consult Note (Signed)
Pharmacy Antibiotic Note  Savannah Galvan is a 86 y.o. female admitted on 12/19/2019 with pneumonia.  Pharmacy has been consulted for Vancomycin and Cefepime dosing.  Plan: Cefepime 2g Q12H   Vancomycin 1250 mg x1 dose followed by 750 mg Q24H   Weight: 54.6 kg (120 lb 4.8 oz)  Temp (24hrs), Avg:97.6 F (36.4 C), Min:97.6 F (36.4 C), Max:97.6 F (36.4 C)  Recent Labs  Lab 12/19/19 0952 12/19/19 0954  WBC  --  12.8*  CREATININE  --  0.91  LATICACIDVEN 4.7*  --     Estimated Creatinine Clearance: 37.5 mL/min (by C-G formula based on SCr of 0.91 mg/dL).    Allergies  Allergen Reactions  . Demerol [Meperidine] Nausea And Vomiting    Antimicrobials this admission: 9/24 Vancomycin/Cefepime >>   Dose adjustments this admission:   Microbiology results: 9/24 BCx: pending  9/24 MRSA PCR: pending  9/24 COVID: (-)  Thank you for allowing pharmacy to be a part of this patient's care.  Rowland Lathe 12/19/2019 11:54 AM

## 2019-12-19 NOTE — Progress Notes (Signed)
RT to patient bedside x2 to assist with Bipap. Patient taking bipap mask off frequently. Patient placed on 4L [place], tolerating well at this time. SAT 99% with bilateral clear/diminished breath sounds. RN aware and at bedside.

## 2019-12-19 NOTE — ED Provider Notes (Signed)
[facility] Department Provider Note   ____________________________________________   First MD Initiated Contact with Patient 12/19/19 [phone]     (approximate)  I have reviewed the triage vital signs and the nursing notes.   HISTORY  Chief Complaint Respiratory Distress History limited by shortness of breath   HPI Savannah Galvan is a 86 y.o. female patient home place of [place] for rehab from mastectomy as I understand it.  She had sustained a pneumothorax after the mastectomy.  Came by EMS for shortness of breath.  Initial O2 sats were 65% after 100% nonrebreather do another one at 85%.  Here they are 90-93 on 100% after 2 duo nebs.  Patient cannot get out more than 1 or 2 words at a time.  Additionally she is very faint and her speech very difficult to get any other history from her.         Past Medical History:  Diagnosis Date  . Breast cancer ([place])   . Chronic kidney disease (CKD), stage II (mild)   . Cyst of pancreas   . Hyperlipidemia, unspecified   . Hypertension   . Hypothyroidism, unspecified   . Nonrheumatic mitral (valve) insufficiency   . Obstructive sleep apnea (adult) (pediatric)     Patient Active Problem List   Diagnosis Date Noted  . Hydropneumothorax 11/19/2019  . Hyponatremia   . Goals of care, counseling/discussion 11/06/2019  . Metastatic breast cancer ([place]) 11/06/2019    Past Surgical History:  Procedure Laterality Date  . BREAST SURGERY    . CHEST TUBE INSERTION Right 11/24/2019   Procedure: CHEST TUBE INSERTION PLEURX;  Surgeon: Nestor Lewandowsky, MD;  Location: [facility] ORS;  Service: General;  Laterality: Right;    Prior to Admission medications   Medication Sig Start Date End Date Taking? Authorizing Provider  ELIQUIS 5 MG TABS tablet Take 5 mg by mouth 2 (two) times daily.  09/23/19  Yes [provider]  letrozole (FEMARA) 2.5 MG tablet Take 1 tablet (2.5 mg total) by mouth daily. 12/11/19  Yes  Sindy Guadeloupe, MD  losartan (COZAAR) 50 MG tablet Take 50 mg by mouth daily.  09/30/19  Yes [provider]  mirtazapine (REMERON) 7.5 MG tablet Take by mouth.   Yes [provider]  Multiple Vitamin (MULTIVITAMIN) tablet Take 1 tablet by mouth daily.  07/21/19  Yes Kathryne Hitch, RN  MYRBETRIQ 50 MG TB24 tablet Take 50 mg by mouth daily. 09/22/19  Yes [provider]  Nutritional Supplements (NUTRITIONAL SHAKE PLUS PROTEIN PO) Take 1 each by mouth daily.    Yes Kathryne Hitch, RN  vitamin B-12 (CYANOCOBALAMIN) 1000 MCG tablet Take 1,000 mcg by mouth daily. 07/21/19  Yes Kathryne Hitch, RN  palbociclib (IBRANCE) 100 MG tablet Take 1 tablet (100 mg total) by mouth daily. Take for 21 days on, 7 days off, repeat every 28 days. Patient not taking: Reported on 12/19/2019 12/17/19   Sindy Guadeloupe, MD    Allergies Demerol [meperidine]  No family history on file.  Social History Social History   Tobacco Use  . Smoking status: Never Smoker  . Smokeless tobacco: Never Used  Vaping Use  . Vaping Use: Never used  Substance Use Topics  . Alcohol use: Yes    Alcohol/week: 7.0 standard drinks    Types: 7 Glasses of wine per week  . Drug use: Never    Review of Systems  Constitutional: No fever/chills Eyes: No visual changes. ENT: No  sore throat. Cardiovascular: Denies chest pain. Respiratory:  shortness of breath. Gastrointestinal: No abdominal pain.  No nausea, no vomiting.  No diarrhea.  No constipation. Genitourinary: Negative for dysuria. Musculoskeletal: Negative for back pain. Skin: Negative for rash. Neurological: Negative for headaches, focal weakness   ____________________________________________   PHYSICAL EXAM:  VITAL SIGNS: ED Triage Vitals  Enc Vitals Group     BP 12/19/19 0955 (!) 175/105     Pulse Rate 12/19/19 0955 (!) 111     Resp 12/19/19 0955 (!) 34     Temp 12/19/19 0955 97.6 F (36.4 C)     Temp Source 12/19/19 0955  Axillary     SpO2 12/19/19 0955 92 %     Weight 12/19/19 0956 120 lb 4.8 oz (54.6 kg)     Height --      Head Circumference --      Peak Flow --      Pain Score 12/19/19 0956 0     Pain Loc --      Pain Edu? --      Excl. in [place]? --     Constitutional: Alert and oriented.  In respiratory distress Eyes: Conjunctivae are normal. PERRL. EOMI. Head: Atraumatic. Nose: No congestion/rhinnorhea. Mouth/Throat: Mucous membranes are moist.   Neck: No stridor.  Cardiovascular: Normal rate, regular rhythm. Grossly normal heart sounds.  Good peripheral circulation. Respiratory: Increased respiratory effort.  No retractions. Lungs crackles in the upper lobes decreased in both lower lobes Gastrointestinal: Soft and nontender. No distention. No abdominal bruits. . Musculoskeletal: No lower extremity tenderness nor edema.  There is marked edema of the right arm Neurologic:  . No gross focal neurologic deficits are appreciated.  Skin:  Skin is warm, dry and intact. No rash noted.  ____________________________________________   LABS (all labs ordered are listed, but only abnormal results are displayed)  Labs Reviewed  COMPREHENSIVE METABOLIC PANEL - Abnormal; Notable for the following components:      Result Value   Glucose, Bld 266 (*)    Albumin 3.1 (*)    GFR calc non Af Amer 57 (*)    All other components within normal limits  BRAIN NATRIURETIC PEPTIDE - Abnormal; Notable for the following components:   B Natriuretic Peptide 126.3 (*)    All other components within normal limits  LACTIC ACID, PLASMA - Abnormal; Notable for the following components:   Lactic Acid, Venous 4.7 (*)    All other components within normal limits  CBC WITH DIFFERENTIAL/PLATELET - Abnormal; Notable for the following components:   WBC 12.8 (*)    Hemoglobin 15.7 (*)    HCT 49.7 (*)    Lymphs Abs 5.0 (*)    All other components within normal limits  BLOOD GAS, VENOUS - Abnormal; Notable for the following  components:   pH, Ven 7.16 (*)    pCO2, Ven 73 (*)    Acid-base deficit 4.7 (*)    All other components within normal limits  TROPONIN I (HIGH SENSITIVITY) - Abnormal; Notable for the following components:   Troponin I (High Sensitivity) 154 (*)    All other components within normal limits  RESPIRATORY PANEL BY RT PCR (FLU A&B, COVID)  LACTIC ACID, PLASMA   ____________________________________________  EKG  EKG read interpreted by me shows sinus tachycardia rate of 118 normal axis very low amplitude in the limb leads slightly higher amplitude in the chest leads no acute ST-T wave changes poor baseline ____________________________________________  RADIOLOGY  ED MD interpretation: Chest x-ray  read by me shows bilateral effusions and increased vascular markings possibly even a pneumonia  Official radiology report(s): DG Chest Portable 1 View  Result Date: 12/19/2019 CLINICAL DATA:  Shortness of breath EXAM: PORTABLE CHEST 1 VIEW COMPARISON:  12/15/2019, 12/08/2019 FINDINGS: Moderate to large bilateral pleural effusions, which appears slightly increased from prior. Right basilar pleural drainage catheter remains in place. Patchy bibasilar opacities, progressed. No pneumothorax. Cardiac silhouette is obscured. Aortic atherosclerosis. IMPRESSION: 1. Moderate to large bilateral pleural effusions, slightly increased from prior. 2. Patchy bibasilar opacities which may reflect atelectasis and/or pneumonia, progressed from prior. Electronically Signed   By: Davina Poke D.O.   On: 12/19/2019 10:07    ____________________________________________   PROCEDURES  Procedure(s) performed (including Critical Care): Critical care time half an hour.  This includes speaking with the patient's sister reviewing the patient's labs and x-rays and EKGs and looking at her old records and comparing these.  Procedures   ____________________________________________   INITIAL IMPRESSION / ASSESSMENT AND  PLAN / ED COURSE  Patient with improving hypoxia improving mental status now that she is on BiPAP.  We will repeat another VBG shortly and make sure her pH is improving as well.  We will plan on getting her into the hospital I have ordered some Lasix for her apparent CHF and elevated BNP.  She is already taking DOAC so I will not give her aspirin with the elevated troponin.  The troponin may only be elevated due to heart strain.            ____________________________________________   FINAL CLINICAL IMPRESSION(S) / ED DIAGNOSES  Final diagnoses:  Acute respiratory failure with hypoxia (HCC)  Congestive heart failure, unspecified HF chronicity, unspecified heart failure type ([place])  Elevated troponin     ED Discharge Orders    None      *Please note:  Savannah Galvan was evaluated in Emergency Department on 12/19/2019 for the symptoms described in the history of present illness. She was evaluated in the context of the global COVID-19 pandemic, which necessitated consideration that the patient might be at risk for infection with the SARS-CoV-2 virus that causes COVID-19. Institutional protocols and algorithms that pertain to the evaluation of patients at risk for COVID-19 are in a state of rapid change based on information released by regulatory bodies including the CDC and federal and state organizations. These policies and algorithms were followed during the patient's care in the ED.  Some ED evaluations and interventions may be delayed as a result of limited staffing during and the pandemic.*   Note:  This document was prepared using Dragon voice recognition software and may include unintentional dictation errors.    Nena Polio, MD 12/19/19 1116

## 2019-12-19 NOTE — Progress Notes (Addendum)
Patient transported @ 1650 to room 260 while on BIPAP.  Patient tolerated well.

## 2019-12-20 DIAGNOSIS — L8990 Pressure ulcer of unspecified site, unspecified stage: Secondary | ICD-10-CM | POA: Insufficient documentation

## 2019-12-20 DIAGNOSIS — C7981 Secondary malignant neoplasm of breast: Secondary | ICD-10-CM

## 2019-12-20 LAB — CBC
HCT: 37.4 % (ref 36.0–46.0)
Hemoglobin: 12.1 g/dL (ref 12.0–15.0)
MCH: 31.2 pg (ref 26.0–34.0)
MCHC: 32.4 g/dL (ref 30.0–36.0)
MCV: 96.4 fL (ref 80.0–100.0)
Platelets: 250 10*3/uL (ref 150–400)
RBC: 3.88 MIL/uL (ref 3.87–5.11)
RDW: 13.2 % (ref 11.5–15.5)
WBC: 8.1 10*3/uL (ref 4.0–10.5)
nRBC: 0.4 % — ABNORMAL HIGH (ref 0.0–0.2)

## 2019-12-20 LAB — LIPID PANEL
Cholesterol: 197 mg/dL (ref 0–200)
HDL: 66 mg/dL (ref >40)
LDL Cholesterol: 115 mg/dL — ABNORMAL HIGH (ref 0–99)
Total CHOL/HDL Ratio: 3 RATIO
Triglycerides: 80 mg/dL (ref <150)
VLDL: 16 mg/dL (ref 0–40)

## 2019-12-20 LAB — HEMOGLOBIN A1C
Hgb A1c MFr Bld: 6.2 % — ABNORMAL HIGH (ref 4.8–5.6)
Mean Plasma Glucose: 131.24 mg/dL

## 2019-12-20 LAB — BASIC METABOLIC PANEL
Anion gap: 14 (ref 5–15)
BUN: 21 mg/dL (ref 8–23)
CO2: 20 mmol/L — ABNORMAL LOW (ref 22–32)
Calcium: 8.9 mg/dL (ref 8.9–10.3)
Chloride: 108 mmol/L (ref 98–111)
Creatinine, Ser: 0.91 mg/dL (ref 0.44–1.00)
GFR calc Af Amer: >60 mL/min (ref >60)
GFR calc non Af Amer: 57 mL/min — ABNORMAL LOW (ref >60)
Glucose, Bld: 161 mg/dL — ABNORMAL HIGH (ref 70–99)
Potassium: 4.1 mmol/L (ref 3.5–5.1)
Sodium: 142 mmol/L (ref 135–145)

## 2019-12-20 LAB — LACTIC ACID, PLASMA
Lactic Acid, Venous: 5.4 mmol/L (ref 0.5–1.9)
Lactic Acid, Venous: 6.5 mmol/L (ref 0.5–1.9)

## 2019-12-20 MED ORDER — LACTATED RINGERS IV BOLUS
500.0000 mL | Freq: Once | INTRAVENOUS | Status: AC
  Administered 2019-12-20: 500 mL via INTRAVENOUS

## 2019-12-20 MED ORDER — MORPHINE SULFATE (PF) 2 MG/ML IV SOLN
1.0000 mg | INTRAVENOUS | Status: DC | PRN
  Administered 2019-12-21 (×2): 1 mg via INTRAVENOUS
  Filled 2019-12-20 (×2): qty 1

## 2019-12-20 MED ORDER — LACTATED RINGERS IV SOLN: INTRAVENOUS | Status: DC

## 2019-12-20 MED ORDER — ENSURE ENLIVE PO LIQD
237.0000 mL | Freq: Three times a day (TID) | ORAL | Status: DC
  Administered 2019-12-20 – 2019-12-22 (×3): 237 mL via ORAL

## 2019-12-20 NOTE — Progress Notes (Addendum)
Patient has moments of confusion - she takes off her [place].   Patient's Sat dropped to 87%.  Replaced [place] and increase O2 to 6L.   O2 improved to 98%   Will continue to monitor

## 2019-12-20 NOTE — Progress Notes (Signed)
   Pt seen by Johnny Bridge yesterday in consult   I have reviewed hospital progress  Note plans for palliative care to see pt   Agree with this. Will sign off.   Please call with questions.    Signed, Dorris Carnes, MD  12/20/2019, 1:03 PM

## 2019-12-20 NOTE — Progress Notes (Signed)
CRITICAL VALUE ALERT  Critical Value:  Lactic 6.5  Date & Time Notied:  12/20/19  Provider Notified: Sharion Settler NP  Orders Received/Actions taken: Yes

## 2019-12-20 NOTE — Progress Notes (Signed)
Patient tolerated bipap about 60% of shift. Patient awoke during night and removed bipap. Switched to  at 4L. Patient then able to fall asleep. Patient's 02 sats have been 98-100 on nasal cannula.

## 2019-12-20 NOTE — Progress Notes (Signed)
Savannah Galvan is an 86 year old female well-known to Savannah Galvan with metastatic breast cancer now with worsening bilateral pleural effusion, anasarca, severe acidosis and respiratory failure.  I have discussed with Savannah Galvan  and Savannah Galvan ( sister) extensively at goal of care.  Savannah Galvan is actually a [employer].  I explained to her the situation the poor prognosis of Savannah Galvan.  I also offer them to try TPA via the Pleurx catheter.  I also explained to them the risk of potential bleeding.  At this time they want to make sure that Savannah Galvan is not suffering and they want me to do a palliative care consult.  For now they do not want any heroic measures any TPA or any thoracentesis or chest tubes. We will make sure that I will pass this along to the primary care team and make sure that we have by by her wishes.  I am going to place palliative care consultation.  We will be available.  Please note that I spent greater than 40 minutes in this encounter with greater than 50% spent in coordination and counseling of her care.

## 2019-12-20 NOTE — Progress Notes (Signed)
Decreased O2 to 5L -  Sats continued to stay above 92%.

## 2019-12-20 NOTE — Hospital Course (Signed)
From H&P by Dr. Blaine Hamper 9/24:  "HPI: Savannah Galvan is a 86 y.o. female with medical history significant of hypertension, hyperlipidemia, OSA, CKD stage II, breast cancer (s/p of mastectomy), hydropneumothorax (s/p of right chest tube placement), right arm DVT on Eliquis, hypothyroidism, depression, who presents with shortness of breath.   Patient was recently hospitalized from 8/25-8/31 due to right-sided hydropneumothorax after mastectomy. Pt is s/p of right chest tube placement. Pt has shortness of breath in the past several days, which has been progressively worsening.  Patient has dry cough, no chest pain.  No fever or chills.  Does not have nausea, vomiting, diarrhea or abdominal pain no symptoms of UTI.  No unilateral weakness. Patient was found to have oxygen desaturation to 65% on room air, which improved to 99% on BiPAP. Patient has a history of right arm DVT on Eliquis.  She still has right arm swelling.     ED Course: pt was found to have WBC 12.8, troponin I 54, BMP 126, negative COVID-19 PCR, lactic acid 4.7, electrolytes renal function okay, temperature 97.6, blood pressure 123/83, tachycardia with heart rate 113, tachypnea with RR 34.  ABG with pH 7.16, PCO2 73, pending PaO2. Patient is admitted to progressive bed as inpatient.   Chest x-ray showed: 1. Moderate to large bilateral pleural effusions, slightly increased from prior. 2. Patchy bibasilar opacities which may reflect atelectasis and/or pneumonia, progressed from prior."

## 2019-12-20 NOTE — Progress Notes (Signed)
PROGRESS NOTE    Savannah Galvan   LKT:[id number]  DOB: [birth year]  PCP: Clide Deutscher, PA    DOA: 12/19/2019 LOS: 1   Brief Narrative   From H&P by Dr. Blaine Hamper 9/24:  "HPI: Savannah Galvan is a 86 y.o. female with medical history significant of hypertension, hyperlipidemia, OSA, CKD stage II, breast cancer (s/p of mastectomy), hydropneumothorax (s/p of right chest tube placement), right arm DVT on Eliquis, hypothyroidism, depression, who presents with shortness of breath.   Patient was recently hospitalized from 8/25-8/31 due to right-sided hydropneumothorax after mastectomy. Pt is s/p of right chest tube placement. Pt has shortness of breath in the past several days, which has been progressively worsening.  Patient has dry cough, no chest pain.  No fever or chills.  Does not have nausea, vomiting, diarrhea or abdominal pain no symptoms of UTI.  No unilateral weakness. Patient was found to have oxygen desaturation to 65% on room air, which improved to 99% on BiPAP. Patient has a history of right arm DVT on Eliquis.  She still has right arm swelling.     ED Course: pt was found to have WBC 12.8, troponin I 54, BMP 126, negative COVID-19 PCR, lactic acid 4.7, electrolytes renal function okay, temperature 97.6, blood pressure 123/83, tachycardia with heart rate 113, tachypnea with RR 34.  ABG with pH 7.16, PCO2 73, pending PaO2. Patient is admitted to progressive bed as inpatient.   Chest x-ray showed: 1. Moderate to large bilateral pleural effusions, slightly increased from prior. 2. Patchy bibasilar opacities which may reflect atelectasis and/or pneumonia, progressed from prior."     Assessment & Plan   Principal Problem:   Acute respiratory failure with hypoxia and hypercapnia (HCC) Active Problems:   Metastatic breast cancer (HCC)   Hydropneumothorax   Bilateral pleural effusion   Hypertension   Depression   Elevated troponin   Swelling in right armpit   HCAP  (healthcare-associated pneumonia)   Severe sepsis with septic shock (HCC)   Arm DVT (deep venous thromboembolism), chronic, right (HCC)   Pressure injury of skin   Acute respiratory failure with hypoxia and hypercapnia: Likely multifactorial etiology, including bilateral pleural effusions and possible HCAP.  Continue Cefepime and d/c Vanc with negative MRSA screen.  Bronchodilators PRN.  BiPAP as needed.  O2 as needed, maintain O2 sat > 90%.  Mucinex.  Monitor closely.  Morphine PRN air hunger/dyspnea.  Severe sepsis with septic shock due to possible HCAP -  severe sepsis with septic shock POA as evidenced by leukocytosis, tachycardia, tachypnea, lactic acid elevated 4.7 in the setting of probable pneumonia.  Hemodynamically stable.   --IV antibiotics as above --Lactate trend: 4.1>>5.4>>6.5 --trend procal and lactate --continue gentle IVF-- caution with worsening effusions --Follow cultures and antigens --GOC - likely will transition to full hospice/comfort care in the next couple of days, but after discussion with family today, will keep antibiotics for now.  Metastatic breast cancer - stop letrozole.  Palliative care consulted.    Hydropneumothorax and Bilateral pleural effusion: s/p R PleurX catheter, followed by Dr. Faith Rogue.   Catheter currently not functioning and won't drain.  Surgery was consulted, and spoke with family this morning, as patient is well-known to their service.  Patient's POA Savannah Galvan who is a hospice nurse and [name] are understanding of her poor prognosis have indicated they do not want the patient to suffer.  Palliative care is consulted but not physically here this weekend.    Hypertension - continue losartan, IV  hydralazine PRN  Depression - continue Remeron  Elevated troponin due to demand ischemia - Troponin 54>> 497.  Cardiology was consulted.  Occurred in setting of respiratory distress and hypoxia, consistent with demand ischemia. Patient without chest  pain or ischemic ECG changes.  Right Upper DVT & edema -on Eliquis, continued     Patient BMI: Body mass index is 19.43 kg/m.   DVT prophylaxis:  apixaban (ELIQUIS) tablet 5 mg   Diet:  Diet Orders (From admission, onward)    Start     Ordered   12/19/19 1311  Diet Heart Room service appropriate? Yes; Fluid consistency: Thin  Diet effective now       Question Answer Comment  Room service appropriate? Yes   Fluid consistency: Thin      12/19/19 1310            Code Status: DNR    Subjective 12/20/19    Patient seen with sister at bedside.  She reports feeling about the same, slightly better but still short of breath.  No other acute complaints.  Discussed with her sister the idea of returning to her ALF with caregivers and hospice care.  Patient is aware and understanding of her prognosis at this time, appears to be handling it well.  Sister says she will tear up for a moment occasionally.     Disposition Plan & Communication   Status is: Inpatient  Remains inpatient appropriate because:Inpatient level of care appropriate due to severity of illness   Dispo: The patient is from: ALF              Anticipated d/c is to: ALF with hospice              Anticipated d/c date is: 2 days              Patient currently is not medically stable to d/c.    Family Communication: Sister, Savannah Galvan, was at bedtime on rounds today, 9/25.  Also spoke with pt's niece and POA, Savannah Galvan who is Barr[employer]'s [employer].  She will be here on Monday, out of town.  We discussed continuing with current medications for now, add low dose morphine in case of air hunger.  They hope for patient to go home to her ALF with hospice and caregivers.   Consults, Procedures, Significant Events   Consultants:   Palliative Care  CT Surgery  Cardiology  Procedures:   none  Antimicrobials:  Anti-infectives (From admission, onward)   Start     Dose/Rate Route Frequency Ordered Stop   12/20/19 1300  vancomycin  (VANCOREADY) IVPB 750 mg/150 mL  Status:  Discontinued        750 mg 150 mL/hr over 60 Minutes Intravenous Every 24 hours 12/19/19 1202 12/20/19 0838   12/19/19 2200  ceFEPIme (MAXIPIME) 2 g in sodium chloride 0.9 % 100 mL IVPB        2 g 200 mL/hr over 30 Minutes Intravenous Every 12 hours 12/19/19 1202     12/19/19 1230  vancomycin (VANCOREADY) IVPB 1250 mg/250 mL        1,250 mg 166.7 mL/hr over 90 Minutes Intravenous  Once 12/19/19 1154 12/19/19 1511   12/19/19 1200  ceFEPIme (MAXIPIME) 2 g in sodium chloride 0.9 % 100 mL IVPB        2 g 200 mL/hr over 30 Minutes Intravenous  Once 12/19/19 1154 12/19/19 1312         Objective   Vitals:   12/20/19 0818  12/20/19 1232 12/20/19 1503 12/20/19 1617  BP: (!) 92/55 134/82 (!) 143/76 94/61  Pulse: 90 (!) 105 82 76  Resp: 19  16   Temp: 97.6 F (36.4 C) 97.6 F (36.4 C) 97.8 F (36.6 C) 98.3 F (36.8 C)  TempSrc: Oral Oral Axillary Oral  SpO2: (!) 67% 100% 100% 100%  Weight:      Height:        Intake/Output Summary (Last 24 hours) at 12/20/2019 1729 Last data filed at 12/20/2019 1350 Gross per 24 hour  Intake 3007.43 ml  Output 200 ml  Net 2807.43 ml   Filed Weights   12/19/19 0956 12/19/19 1729  Weight: 54.6 kg 54.6 kg    Physical Exam:  General exam: awake, alert, no acute distress, cachectic Respiratory system: diminished until upper 1/3 of posterior lung fields b/l, no wheezes or rhonchi, shallow inspirations, conversational dyspnea. Cardiovascular system: normal S1/S2, RRR, no pedal edema.   Gastrointestinal system: soft, NT, ND Central nervous system: no gross focal neurologic deficits, normal speech Psychiatry: normal mood, congruent affect, judgement and insight appear normal  Labs   Data Reviewed: I have personally reviewed following labs and imaging studies  CBC: Recent Labs  Lab 12/19/19 0954 12/20/19 0459  WBC 12.8* 8.1  NEUTROABS 6.5  --   HGB 15.7* 12.1  HCT 49.7* 37.4  MCV 97.8 96.4  PLT  353 426   Basic Metabolic Panel: Recent Labs  Lab 12/19/19 0954 12/20/19 0459  NA 140 142  K 3.8 4.1  CL 103 108  CO2 22 20*  GLUCOSE 266* 161*  BUN 10 21  CREATININE 0.91 0.91  CALCIUM 9.2 8.9   GFR: Estimated Creatinine Clearance: 37.5 mL/min (by C-G formula based on SCr of 0.91 mg/dL). Liver Function Tests: Recent Labs  Lab 12/19/19 0954 12/19/19 1316  AST 36  --   ALT 30  --   ALKPHOS 79  --   BILITOT 0.6  --   PROT 6.5 6.7  ALBUMIN 3.1*  --    No results for input(s): LIPASE, AMYLASE in the last 168 hours. No results for input(s): AMMONIA in the last 168 hours. Coagulation Profile: Recent Labs  Lab 12/19/19 0954  INR 1.2   Cardiac Enzymes: No results for input(s): CKTOTAL, CKMB, CKMBINDEX, TROPONINI in the last 168 hours. BNP (last 3 results) No results for input(s): PROBNP in the last 8760 hours. HbA1C: Recent Labs    12/20/19 0459  HGBA1C 6.2*   CBG: No results for input(s): GLUCAP in the last 168 hours. Lipid Profile: Recent Labs    12/20/19 0459  CHOL 197  HDL 66  LDLCALC 115*  TRIG 80  CHOLHDL 3.0   Thyroid Function Tests: No results for input(s): TSH, T4TOTAL, FREET4, T3FREE, THYROIDAB in the last 72 hours. Anemia Panel: No results for input(s): VITAMINB12, FOLATE, FERRITIN, TIBC, IRON, RETICCTPCT in the last 72 hours. Sepsis Labs: Recent Labs  Lab 12/19/19 1151 12/19/19 1525 12/20/19 0123 12/20/19 0456  PROCALCITON <0.10  --   --   --   LATICACIDVEN 3.2* 4.1* 5.4* 6.5*    Recent Results (from the past 240 hour(s))  Respiratory Panel by RT PCR (Flu A&B, Covid) - Nasopharyngeal Swab     Status: None   Collection Time: 12/19/19  9:52 AM   Specimen: Nasopharyngeal Swab  Result Value Ref Range Status   SARS Coronavirus 2 by RT PCR NEGATIVE NEGATIVE Final    Comment: (NOTE) SARS-CoV-2 target nucleic acids are NOT DETECTED.  The SARS-CoV-2  RNA is generally detectable in upper respiratoy specimens during the acute phase of  infection. The lowest concentration of SARS-CoV-2 viral copies this assay can detect is 131 copies/mL. A negative result does not preclude SARS-Cov-2 infection and should not be used as the sole basis for treatment or other patient management decisions. A negative result may occur with  improper specimen collection/handling, submission of specimen other than nasopharyngeal swab, presence of viral mutation(s) within the areas targeted by this assay, and inadequate number of viral copies (<131 copies/mL). A negative result must be combined with clinical observations, patient history, and epidemiological information. The expected result is Negative.  Fact Sheet for Patients:  PinkCheek.be  Fact Sheet for Healthcare Providers:  GravelBags.it  This test is no t yet approved or cleared by the [place] FDA and  has been authorized for detection and/or diagnosis of SARS-CoV-2 by FDA under an Emergency Use Authorization (EUA). This EUA will remain  in effect (meaning this test can be used) for the duration of the COVID-19 declaration under Section 564(b)(1) of the Act, 21 U.S.C. section 360bbb-3(b)(1), unless the authorization is terminated or revoked sooner.     Influenza A by PCR NEGATIVE NEGATIVE Final   Influenza B by PCR NEGATIVE NEGATIVE Final    Comment: (NOTE) The Xpert Xpress SARS-CoV-2/FLU/RSV assay is intended as an aid in  the diagnosis of influenza from Nasopharyngeal swab specimens and  should not be used as a sole basis for treatment. Nasal washings and  aspirates are unacceptable for Xpert Xpress SARS-CoV-2/FLU/RSV  testing.  Fact Sheet for Patients: PinkCheek.be  Fact Sheet for Healthcare Providers: GravelBags.it  This test is not yet approved or cleared by the [place] FDA and  has been authorized for detection and/or diagnosis of SARS-CoV-2  by  FDA under an Emergency Use Authorization (EUA). This EUA will remain  in effect (meaning this test can be used) for the duration of the  Covid-19 declaration under Section 564(b)(1) of the Act, 21  U.S.C. section 360bbb-3(b)(1), unless the authorization is  terminated or revoked. Performed at [facility], [address] [place], [place] [address]   CULTURE, BLOOD (ROUTINE X 2) w Reflex to ID Panel     Status: None (Preliminary result)   Collection Time: 12/19/19 12:35 PM   Specimen: BLOOD  Result Value Ref Range Status   Specimen Description BLOOD LEFT UPPER ARM  Final   Special Requests   Final    BOTTLES DRAWN AEROBIC AND ANAEROBIC Blood Culture results may not be optimal due to an excessive volume of blood received in culture bottles   Culture   Final    NO GROWTH < 24 HOURS Performed at [facility], [address] [place], [place] [address]    Report Status PENDING  Incomplete  CULTURE, BLOOD (ROUTINE X 2) w Reflex to ID Panel     Status: None (Preliminary result)   Collection Time: 12/19/19 12:36 PM   Specimen: BLOOD  Result Value Ref Range Status   Specimen Description BLOOD LEFT FORE ARM  Final   Special Requests   Final    BOTTLES DRAWN AEROBIC AND ANAEROBIC Blood Culture adequate volume   Culture   Final    NO GROWTH < 24 HOURS Performed at [facility], [address], [place], [place] [address]    Report Status PENDING  Incomplete  MRSA PCR Screening     Status: None   Collection Time: 12/19/19  8:25 PM   Specimen: Nasal  Mucosa; Nasopharyngeal  Result Value Ref Range Status   MRSA by PCR NEGATIVE NEGATIVE Final    Comment:        The GeneXpert MRSA Assay (FDA approved for NASAL specimens only), is one component of a comprehensive MRSA colonization surveillance program. It is not intended to diagnose MRSA infection nor to guide or monitor treatment for MRSA infections. Performed at [facility], [address] [place], [place] [address]       Imaging Studies   CT CHEST WO CONTRAST  Result Date: 12/19/2019 CLINICAL DATA:  Pleural effusion, malignancy suspected 86 year old with bilateral pleural effusion, hydropneumothorax status post chest tube placement on the right. Measure concurrent bilateral pleural effusion. Respiratory distress. Post mastectomy. EXAM: CT CHEST WITHOUT CONTRAST TECHNIQUE: Multidetector CT imaging of the chest was performed following the standard protocol without IV contrast. COMPARISON:  Multiple prior radiographs.  PET CT 10/21/2019. FINDINGS: Cardiovascular: Aortic atherosclerosis. There are coronary artery calcifications. Upper normal heart size. No pericardial effusion. Mediastinum/Nodes: Evaluation for adenopathy is limited in the absence of contrast and paucity of mediastinal fat. There is no obvious bulky mediastinal adenopathy. Small lymph nodes on prior PET CT are not definitively visualized on the current exam. Surgical clips in the right axilla without evidence of axillary adenopathy. Esophagus is slightly patulous. No obvious esophageal wall thickening. Lungs/Pleura: Large right pleural effusion with PleurX catheter in place, tip in the posterior pleural space. There is no obvious pleural soft tissue density. No extrapleural air. There is complete compressive atelectasis of the right lower lobe, mild compressive atelectasis of the right middle lobe and moderate dependent atelectasis in the right upper lobe. 3 mm subpleural nodule in the right upper lobe, series 3, image 49. 6 mm right middle lobe pulmonary nodule, series 3, image 82. Large left pleural effusion with near complete compressive atelectasis of the left lower lobe moderate compressive atelectasis of the left upper lobe. Previously described perifissural nodule in the lingula is not definitively seen on the current exam. There is mild apical septal thickening. Retained mucus within the trachea. Upper Abdomen:  Low-density lesions in the liver appears similar to prior PET and likely represent cysts. Paucity of intra-abdominal fat limits detailed upper abdominal assessment. Musculoskeletal: There is right chest wall skin thickening and subcutaneous edema. Right axillary skin thickening is partially included. Bones are diffusely under mineralized. No evidence of focal bone lesion, motion limits detailed assessment. Paucity of body fat suggesting cachexia. IMPRESSION: 1. Large right pleural effusion with PleurX catheter in place, tip in the posterior pleural space. No extrapleural air/pneumothorax. Complete compressive atelectasis of the right lower lobe with moderate atelectasis of the right upper lobe. 2. Large left pleural effusion with near complete compressive atelectasis of the left lower lobe and moderate compressive atelectasis of the right upper and middle lobes. 3. Right chest wall skin thickening and subcutaneous edema. Right axillary skin thickening is partially included. 4. Small pulmonary nodules in the right lung, largest measuring 6 mm in the right middle lobe. These appear new from prior PET, suspicious for metastasis. The previous lingular nodule is not definitively seen. 5. Mild apical septal thickening, can be seen with pulmonary edema. 6. Retained mucus in the trachea. Aortic Atherosclerosis (ICD10-I70.0). Electronically Signed   By: Keith Rake M.D.   On: 12/19/2019 17:06   DG Chest Portable 1 View  Result Date: 12/19/2019 CLINICAL DATA:  Shortness of breath EXAM: PORTABLE CHEST 1 VIEW COMPARISON:  12/15/2019, 12/08/2019 FINDINGS: Moderate to large bilateral pleural effusions, which  appears slightly increased from prior. Right basilar pleural drainage catheter remains in place. Patchy bibasilar opacities, progressed. No pneumothorax. Cardiac silhouette is obscured. Aortic atherosclerosis. IMPRESSION: 1. Moderate to large bilateral pleural effusions, slightly increased from prior. 2. Patchy  bibasilar opacities which may reflect atelectasis and/or pneumonia, progressed from prior. Electronically Signed   By: Davina Poke D.O.   On: 12/19/2019 10:07     Medications   Scheduled Meds: . alteplase (tPA) 10mg  in NS 53mL for Dr.Oaks (intrapleural administration/[facility])   Intrapleural Once  . apixaban  5 mg Oral BID  . feeding supplement  1 Container Oral Daily  . letrozole  2.5 mg Oral Daily  . losartan  50 mg Oral Daily  . mirabegron ER  50 mg Oral Daily  . mirtazapine  7.5 mg Oral QHS  . multivitamin with minerals  1 tablet Oral Daily  . vitamin B-12  1,000 mcg Oral Daily   Continuous Infusions: . ceFEPime (MAXIPIME) IV 2 g (12/20/19 1022)  . lactated ringers Stopped (12/20/19 0657)       LOS: 1 day    Time spent: 40 minutes with >50% spent in coordination of care and direct patient contact.    Ezekiel Slocumb, DO [facility]  12/20/2019, 5:29 PM    If 7PM-7AM, please contact night-coverage. How to contact the [facility] Attending or Consulting provider [place] or covering provider during after hours [place], for this patient?    1. Check the care team in [facility] and look for a) attending/consulting [facility] provider listed and b) the [facility] team listed 2. Log into [email or web address] and use [place]'s universal password to access. If you do not have the password, please contact the hospital operator. 3. Locate the [facility] provider you are looking for under [facility] and page to a number that you can be directly reached. 4. If you still have difficulty reaching the provider, please page the [facility] (Director on Call) for the Hospitalists listed on amion for assistance.

## 2019-12-20 NOTE — Progress Notes (Signed)
Patient removed bipap mask and refusing to put back on. Patient placed on 4L [place]. Patient O2 sats in the 70s. Provider paged. RN at bedside.

## 2019-12-20 NOTE — Progress Notes (Signed)
Cross Cover Patient with worsening lactic acidosis despite additional fluids. Suspect metabolic from ketosis as patient with very little intake.  Does not to be from infectious source as procalcitonin normal, no signs of hypoperfusion, no leukocytosis and vitals are stable.  No additional fluids given. Maintenance fluids changed to LR from normal saline to avoid chlorine acidosis

## 2019-12-20 NOTE — Progress Notes (Signed)
Initial Nutrition Assessment  DOCUMENTATION CODES:   Not applicable  INTERVENTION:  Ensure Enlive po TID, each supplement provides 350 kcal and 20 grams of protein  NUTRITION DIAGNOSIS:   Inadequate oral intake related to acute illness, decreased appetite (worsening SOB 2/2 acute respiratory failure with hypoxia and hypercapnia due to worsening bilateral pleural effusions s/p R chest tube) as evidenced by meal completion < 25%.    GOAL:   Patient will meet greater than or equal to 90% of their needs    MONITOR:   Labs, I & O's, Supplement acceptance, PO intake, Weight trends  REASON FOR ASSESSMENT:   Malnutrition Screening Tool    ASSESSMENT:  RD working remotely.  86 year old female with history of HTN, HLD, OSA, CKD stage II, breast cancer s/p mastectomy, recent admission 8/25-8/31 for hydropneumothorax s/p right chest tube, DVT on Eliquis, hypothyroidism, depression who presents from ALF with progressively worsening SOB over the past several days.  Pt with metastatic breast cancer admitted for acute respiratory failure with hypoxia and hypercapnia  Per notes, pt with worsening lactic acidosis despite additional fluids, worsening bilateral pleural effusion, anasarca, severe acidosis, and respiratory failure. Surgery has had discussion regarding poor prognosis with POA and pt sister. No plans for TPA, thoracentesis or chest tubes a this time. Palliative consult is pending.   Per flowsheets, no intake of breakfast or lunch meals today. Will order Ensure supplement TID to aid with meeting needs and continue to monitor for [place]  Per chart, weights have been stable over the past 2 months. 12/19/19 54.6 kg  12/15/19 52.2 kg  12/11/19 53.2 kg  12/08/19 52.3 kg  11/24/19 54 kg  11/17/19 51.7 kg  11/06/19 52.8 kg  10/21/19 53.1 kg  10/10/19 53.3 kg    I/Os: +2591.9 ml x 24 hrs UOP: 200 ml x 24 hrs Medications reviewed and include: Remeron, MVI, B12 IVPB:  Maxipime  IVF: Lactated ringers Labs reviewed   NUTRITION - FOCUSED PHYSICAL EXAM: Unable to complete at this time, RD working remotely.  Diet Order:   Diet Order            Diet Heart Room service appropriate? Yes; Fluid consistency: Thin  Diet effective now                 EDUCATION NEEDS:   No education needs have been identified at this time  Skin:  Skin Assessment: Skin Integrity Issues: Skin Integrity Issues:: Stage I, Other (Comment) Stage I: posterior sacrum Other: R chest tube  Last BM:  pta  Height:   Ht Readings from Last 1 Encounters:  12/19/19 5\' 6"  (1.676 m)    Weight:   Wt Readings from Last 1 Encounters:  12/19/19 54.6 kg    BMI:  Body mass index is 19.43 kg/m.  Estimated Nutritional Needs:   Kcal:  1700-1900  Protein:  82-95  Fluid:  >1.4 L/day   Lajuan Lines, RD, LDN Clinical Nutrition After Hours/Weekend Pager # in [place]

## 2019-12-21 DIAGNOSIS — J9602 Acute respiratory failure with hypercapnia: Secondary | ICD-10-CM

## 2019-12-21 MED ORDER — LORAZEPAM 2 MG/ML IJ SOLN
0.5000 mg | INTRAMUSCULAR | Status: DC | PRN
  Administered 2019-12-21: 0.5 mg via INTRAVENOUS
  Filled 2019-12-21: qty 1

## 2019-12-21 NOTE — Progress Notes (Signed)
PROGRESS NOTE    Savannah Galvan   VZC:[id number]  DOB: Aug 15, 1929  PCP: Clide Deutscher, PA    DOA: 12/19/2019 LOS: 2   Brief Narrative   From H&P by Dr. Blaine Hamper 9/24:  "HPI: Savannah Galvan is a 86 y.o. female with medical history significant of hypertension, hyperlipidemia, OSA, CKD stage II, breast cancer (s/p of mastectomy), hydropneumothorax (s/p of right chest tube placement), right arm DVT on Eliquis, hypothyroidism, depression, who presents with shortness of breath.   Patient was recently hospitalized from 8/25-8/31 due to right-sided hydropneumothorax after mastectomy. Pt is s/p of right chest tube placement. Pt has shortness of breath in the past several days, which has been progressively worsening.  Patient has dry cough, no chest pain.  No fever or chills.  Does not have nausea, vomiting, diarrhea or abdominal pain no symptoms of UTI.  No unilateral weakness. Patient was found to have oxygen desaturation to 65% on room air, which improved to 99% on BiPAP. Patient has a history of right arm DVT on Eliquis.  She still has right arm swelling.     ED Course: pt was found to have WBC 12.8, troponin I 54, BMP 126, negative COVID-19 PCR, lactic acid 4.7, electrolytes renal function okay, temperature 97.6, blood pressure 123/83, tachycardia with heart rate 113, tachypnea with RR 34.  ABG with pH 7.16, PCO2 73, pending PaO2. Patient is admitted to progressive bed as inpatient.   Chest x-ray showed: 1. Moderate to large bilateral pleural effusions, slightly increased from prior. 2. Patchy bibasilar opacities which may reflect atelectasis and/or pneumonia, progressed from prior."     Assessment & Plan   Principal Problem:   Acute respiratory failure with hypoxia and hypercapnia (HCC) Active Problems:   Metastatic breast cancer (HCC)   Hydropneumothorax   Bilateral pleural effusion   Hypertension   Depression   Elevated troponin   Swelling in right armpit   HCAP  (healthcare-associated pneumonia)   Severe sepsis with septic shock (HCC)   Arm DVT (deep venous thromboembolism), chronic, right (HCC)   Pressure injury of skin   Acute respiratory failure with hypoxia and hypercapnia: Likely multifactorial etiology, including bilateral pleural effusions and possible HCAP.  Continue Cefepime and d/c Vanc with negative MRSA screen.  Bronchodilators PRN.  BiPAP as needed.  O2 as needed, maintain O2 sat > 90%.  Mucinex.  Monitor closely.  Morphine PRN air hunger/dyspnea.  Severe sepsis with septic shock due to possible HCAP -  severe sepsis with septic shock POA as evidenced by leukocytosis, tachycardia, tachypnea, lactic acid elevated 4.7 in the setting of probable pneumonia.  Hemodynamically stable.   --IV antibiotics as above --Lactate trend: 4.1>>5.4>>6.5 --trend procal and lactate --continue gentle IVF-- caution with worsening effusions --Follow cultures and antigens --GOC - likely will transition to full hospice/comfort care in the next couple of days, but after discussion with family today, will keep antibiotics for now.  Metastatic breast cancer - stop letrozole.  Palliative care consulted.  Patient and family likely will transition to hospice/comfort.   Patient's niece is her POA Biochemist, clinical) and is a Merchandiser, retail, and Sister Lenell Antu are understanding of her poor prognosis have indicated they do not want the patient to suffer.    Bilateral malignant pleural effusion: s/p R PleurX catheter, followed by Dr. Faith Rogue.   Catheter currently not functioning and won't drain.  Surgery was consulted, and spoke with family this morning, as patient is well-known to their service.   --POA consents to palliative thoracentesis  with IR or surgery able to perform --ordered U/S guided thoracentesis for tomorrow with IR   Hypertension - continue losartan, IV hydralazine PRN  Depression - continue Remeron  Elevated troponin due to demand ischemia - Troponin 54>> 497.   Cardiology was consulted.  Occurred in setting of respiratory distress and hypoxia, consistent with demand ischemia. Patient without chest pain or ischemic ECG changes.  Right Upper DVT & edema -on Eliquis, continued     Patient BMI: Body mass index is 20.96 kg/m.   DVT prophylaxis:    Diet:  Diet Orders (From admission, onward)    Start     Ordered   12/19/19 1311  Diet Heart Room service appropriate? Yes; Fluid consistency: Thin  Diet effective now       Question Answer Comment  Room service appropriate? Yes   Fluid consistency: Thin      12/19/19 1310            Code Status: DNR    Subjective 12/21/19    Patient seen with sister at bedside.  Patient looks worse this AM and sister is tearful.  Patient having persistent productive cough which kept her from sleeping.  Remains short of breath and morphine has been given.  Appears she is fighting off sleep.  She denies fever/chills.  Lenell Antu asks about taking fluid off her lungs for patient's comfort.   Disposition Plan & Communication   Status is: Inpatient  Remains inpatient appropriate because:Inpatient level of care appropriate due to severity of illness   Dispo: The patient is from: ALF              Anticipated d/c is to: ALF with hospice              Anticipated d/c date is: 2 days              Patient currently is not medically stable to d/c.    Family Communication: Sister, Lenell Antu, was at bedtime on rounds today, 9/26.   Also spoke with niece/POA, Savannah Galvan, who agrees with palliative thoracentesis.    Consults, Procedures, Significant Events   Consultants:   Palliative Care  CT Surgery  Cardiology  Procedures:   none  Antimicrobials:  Anti-infectives (From admission, onward)   Start     Dose/Rate Route Frequency Ordered Stop   12/20/19 1300  vancomycin (VANCOREADY) IVPB 750 mg/150 mL  Status:  Discontinued        750 mg 150 mL/hr over 60 Minutes Intravenous Every 24 hours 12/19/19 1202 12/20/19  0838   12/19/19 2200  ceFEPIme (MAXIPIME) 2 g in sodium chloride 0.9 % 100 mL IVPB        2 g 200 mL/hr over 30 Minutes Intravenous Every 12 hours 12/19/19 1202     12/19/19 1230  vancomycin (VANCOREADY) IVPB 1250 mg/250 mL        1,250 mg 166.7 mL/hr over 90 Minutes Intravenous  Once 12/19/19 1154 12/19/19 1511   12/19/19 1200  ceFEPIme (MAXIPIME) 2 g in sodium chloride 0.9 % 100 mL IVPB        2 g 200 mL/hr over 30 Minutes Intravenous  Once 12/19/19 1154 12/19/19 1312         Objective   Vitals:   12/20/19 2256 12/21/19 0646 12/21/19 0804 12/21/19 1147  BP:  114/69 131/87 102/63  Pulse: (!) 110 97 (!) 104 98  Resp: (!) 27 16 20 19   Temp:  98 F (36.7 C) 97.9 F (36.6 C) 98.4  F (36.9 C)  TempSrc:  Oral Oral Oral  SpO2: 96% 100% 99% 99%  Weight:  58.9 kg    Height:        Intake/Output Summary (Last 24 hours) at 12/21/2019 1530 Last data filed at 12/20/2019 2222 Gross per 24 hour  Intake 0 ml  Output 400 ml  Net -400 ml   Filed Weights   12/19/19 0956 12/19/19 1729 12/21/19 0646  Weight: 54.6 kg 54.6 kg 58.9 kg    Physical Exam:  General exam: awake but appears very drowsy, no acute distress, cachectic Respiratory system: diminished b/l with coarse rhonchi anteriorly and intermittent wheezes, tachypneic Cardiovascular system: normal S1/S2, RRR, no pedal edema.   Central nervous system: no gross focal neurologic deficits, normal speech Psychiatry: normal mood, congruent affect, judgement and insight appear normal  Labs   Data Reviewed: I have personally reviewed following labs and imaging studies  CBC: Recent Labs  Lab 12/19/19 0954 12/20/19 0459  WBC 12.8* 8.1  NEUTROABS 6.5  --   HGB 15.7* 12.1  HCT 49.7* 37.4  MCV 97.8 96.4  PLT 353 185   Basic Metabolic Panel: Recent Labs  Lab 12/19/19 0954 12/20/19 0459  NA 140 142  K 3.8 4.1  CL 103 108  CO2 22 20*  GLUCOSE 266* 161*  BUN 10 21  CREATININE 0.91 0.91  CALCIUM 9.2 8.9    GFR: Estimated Creatinine Clearance: 40.5 mL/min (by C-G formula based on SCr of 0.91 mg/dL). Liver Function Tests: Recent Labs  Lab 12/19/19 0954 12/19/19 1316  AST 36  --   ALT 30  --   ALKPHOS 79  --   BILITOT 0.6  --   PROT 6.5 6.7  ALBUMIN 3.1*  --    No results for input(s): LIPASE, AMYLASE in the last 168 hours. No results for input(s): AMMONIA in the last 168 hours. Coagulation Profile: Recent Labs  Lab 12/19/19 0954  INR 1.2   Cardiac Enzymes: No results for input(s): CKTOTAL, CKMB, CKMBINDEX, TROPONINI in the last 168 hours. BNP (last 3 results) No results for input(s): PROBNP in the last 8760 hours. HbA1C: Recent Labs    12/20/19 0459  HGBA1C 6.2*   CBG: No results for input(s): GLUCAP in the last 168 hours. Lipid Profile: Recent Labs    12/20/19 0459  CHOL 197  HDL 66  LDLCALC 115*  TRIG 80  CHOLHDL 3.0   Thyroid Function Tests: No results for input(s): TSH, T4TOTAL, FREET4, T3FREE, THYROIDAB in the last 72 hours. Anemia Panel: No results for input(s): VITAMINB12, FOLATE, FERRITIN, TIBC, IRON, RETICCTPCT in the last 72 hours. Sepsis Labs: Recent Labs  Lab 12/19/19 1151 12/19/19 1525 12/20/19 0123 12/20/19 0456  PROCALCITON <0.10  --   --   --   LATICACIDVEN 3.2* 4.1* 5.4* 6.5*    Recent Results (from the past 240 hour(s))  Respiratory Panel by RT PCR (Flu A&B, Covid) - Nasopharyngeal Swab     Status: None   Collection Time: 12/19/19  9:52 AM   Specimen: Nasopharyngeal Swab  Result Value Ref Range Status   SARS Coronavirus 2 by RT PCR NEGATIVE NEGATIVE Final    Comment: (NOTE) SARS-CoV-2 target nucleic acids are NOT DETECTED.  The SARS-CoV-2 RNA is generally detectable in upper respiratoy specimens during the acute phase of infection. The lowest concentration of SARS-CoV-2 viral copies this assay can detect is 131 copies/mL. A negative result does not preclude SARS-Cov-2 infection and should not be used as the sole basis for  treatment or other patient management decisions. A negative result may occur with  improper specimen collection/handling, submission of specimen other than nasopharyngeal swab, presence of viral mutation(s) within the areas targeted by this assay, and inadequate number of viral copies (<131 copies/mL). A negative result must be combined with clinical observations, patient history, and epidemiological information. The expected result is Negative.  Fact Sheet for Patients:  PinkCheek.be  Fact Sheet for Healthcare Providers:  GravelBags.it  This test is no t yet approved or cleared by the [place] FDA and  has been authorized for detection and/or diagnosis of SARS-CoV-2 by FDA under an Emergency Use Authorization (EUA). This EUA will remain  in effect (meaning this test can be used) for the duration of the COVID-19 declaration under Section 564(b)(1) of the Act, 21 U.S.C. section 360bbb-3(b)(1), unless the authorization is terminated or revoked sooner.     Influenza A by PCR NEGATIVE NEGATIVE Final   Influenza B by PCR NEGATIVE NEGATIVE Final    Comment: (NOTE) The Xpert Xpress SARS-CoV-2/FLU/RSV assay is intended as an aid in  the diagnosis of influenza from Nasopharyngeal swab specimens and  should not be used as a sole basis for treatment. Nasal washings and  aspirates are unacceptable for Xpert Xpress SARS-CoV-2/FLU/RSV  testing.  Fact Sheet for Patients: PinkCheek.be  Fact Sheet for Healthcare Providers: GravelBags.it  This test is not yet approved or cleared by the [place] FDA and  has been authorized for detection and/or diagnosis of SARS-CoV-2 by  FDA under an Emergency Use Authorization (EUA). This EUA will remain  in effect (meaning this test can be used) for the duration of the  Covid-19 declaration under Section 564(b)(1) of the Act, 21   U.S.C. section 360bbb-3(b)(1), unless the authorization is  terminated or revoked. Performed at [facility], [address] [place], [place] [address]   CULTURE, BLOOD (ROUTINE X 2) w Reflex to ID Panel     Status: None (Preliminary result)   Collection Time: 12/19/19 12:35 PM   Specimen: BLOOD  Result Value Ref Range Status   Specimen Description BLOOD LEFT UPPER ARM  Final   Special Requests   Final    BOTTLES DRAWN AEROBIC AND ANAEROBIC Blood Culture results may not be optimal due to an excessive volume of blood received in culture bottles   Culture   Final    NO GROWTH 2 DAYS Performed at [facility], [address] [place], [place] [address]    Report Status PENDING  Incomplete  CULTURE, BLOOD (ROUTINE X 2) w Reflex to ID Panel     Status: None (Preliminary result)   Collection Time: 12/19/19 12:36 PM   Specimen: BLOOD  Result Value Ref Range Status   Specimen Description BLOOD LEFT FORE ARM  Final   Special Requests   Final    BOTTLES DRAWN AEROBIC AND ANAEROBIC Blood Culture adequate volume   Culture   Final    NO GROWTH 2 DAYS Performed at [facility], [address] [place], [place] [address]    Report Status PENDING  Incomplete  MRSA PCR Screening     Status: None   Collection Time: 12/19/19  8:25 PM   Specimen: Nasal Mucosa; Nasopharyngeal  Result Value Ref Range Status   MRSA by PCR NEGATIVE NEGATIVE Final    Comment:        The GeneXpert MRSA Assay (FDA approved for NASAL specimens only), is one component of a comprehensive MRSA colonization surveillance program. It is  not intended to diagnose MRSA infection nor to guide or monitor treatment for MRSA infections. Performed at [facility], [address] [place],  [address]       Imaging Studies   CT CHEST WO CONTRAST  Result Date: 12/19/2019 CLINICAL DATA:  Pleural effusion, malignancy suspected 86 year old with bilateral pleural effusion,  hydropneumothorax status post chest tube placement on the right. Measure concurrent bilateral pleural effusion. Respiratory distress. Post mastectomy. EXAM: CT CHEST WITHOUT CONTRAST TECHNIQUE: Multidetector CT imaging of the chest was performed following the standard protocol without IV contrast. COMPARISON:  Multiple prior radiographs.  PET CT 10/21/2019. FINDINGS: Cardiovascular: Aortic atherosclerosis. There are coronary artery calcifications. Upper normal heart size. No pericardial effusion. Mediastinum/Nodes: Evaluation for adenopathy is limited in the absence of contrast and paucity of mediastinal fat. There is no obvious bulky mediastinal adenopathy. Small lymph nodes on prior PET CT are not definitively visualized on the current exam. Surgical clips in the right axilla without evidence of axillary adenopathy. Esophagus is slightly patulous. No obvious esophageal wall thickening. Lungs/Pleura: Large right pleural effusion with PleurX catheter in place, tip in the posterior pleural space. There is no obvious pleural soft tissue density. No extrapleural air. There is complete compressive atelectasis of the right lower lobe, mild compressive atelectasis of the right middle lobe and moderate dependent atelectasis in the right upper lobe. 3 mm subpleural nodule in the right upper lobe, series 3, image 49. 6 mm right middle lobe pulmonary nodule, series 3, image 82. Large left pleural effusion with near complete compressive atelectasis of the left lower lobe moderate compressive atelectasis of the left upper lobe. Previously described perifissural nodule in the lingula is not definitively seen on the current exam. There is mild apical septal thickening. Retained mucus within the trachea. Upper Abdomen: Low-density lesions in the liver appears similar to prior PET and likely represent cysts. Paucity of intra-abdominal fat limits detailed upper abdominal assessment. Musculoskeletal: There is right chest wall skin  thickening and subcutaneous edema. Right axillary skin thickening is partially included. Bones are diffusely under mineralized. No evidence of focal bone lesion, motion limits detailed assessment. Paucity of body fat suggesting cachexia. IMPRESSION: 1. Large right pleural effusion with PleurX catheter in place, tip in the posterior pleural space. No extrapleural air/pneumothorax. Complete compressive atelectasis of the right lower lobe with moderate atelectasis of the right upper lobe. 2. Large left pleural effusion with near complete compressive atelectasis of the left lower lobe and moderate compressive atelectasis of the right upper and middle lobes. 3. Right chest wall skin thickening and subcutaneous edema. Right axillary skin thickening is partially included. 4. Small pulmonary nodules in the right lung, largest measuring 6 mm in the right middle lobe. These appear new from prior PET, suspicious for metastasis. The previous lingular nodule is not definitively seen. 5. Mild apical septal thickening, can be seen with pulmonary edema. 6. Retained mucus in the trachea. Aortic Atherosclerosis (ICD10-I70.0). Electronically Signed   By: Keith Rake M.D.   On: 12/19/2019 17:06     Medications   Scheduled Meds: . alteplase (tPA) 10mg  in NS 40mL for Dr.Oaks (intrapleural administration/[facility])   Intrapleural Once  . feeding supplement  1 Container Oral Daily  . feeding supplement (ENSURE ENLIVE)  237 mL Oral TID BM  . losartan  50 mg Oral Daily  . mirabegron ER  50 mg Oral Daily  . mirtazapine  7.5 mg Oral QHS   Continuous Infusions: . ceFEPime (MAXIPIME) IV 2 g (12/21/19 0900)  LOS: 2 days    Time spent: 32 minutes with >50% spent in coordination of care and direct patient contact.    Ezekiel Slocumb, DO [facility]  12/21/2019, 3:30 PM    If 7PM-7AM, please contact night-coverage. How to contact the [facility] Attending or Consulting provider [place] or covering provider  during after hours [place], for this patient?    1. Check the care team in [facility] and look for a) attending/consulting [facility] provider listed and b) the [facility] team listed 2. Log into [email or web address] and use [place]'s universal password to access. If you do not have the password, please contact the hospital operator. 3. Locate the [facility] provider you are looking for under [facility] and page to a number that you can be directly reached. 4. If you still have difficulty reaching the provider, please page the [facility] (Director on Call) for the Hospitalists listed on amion for assistance.

## 2019-12-22 DIAGNOSIS — Z515 Encounter for palliative care: Secondary | ICD-10-CM

## 2019-12-22 MED ORDER — DM-GUAIFENESIN ER 30-600 MG PO TB12: 1.0000 | ORAL_TABLET | Freq: Two times a day (BID) | ORAL | Status: AC | PRN

## 2019-12-22 MED ORDER — ALBUTEROL SULFATE (2.5 MG/3ML) 0.083% IN NEBU: 2.5000 mg | INHALATION_SOLUTION | RESPIRATORY_TRACT | 12 refills | Status: AC | PRN

## 2019-12-22 MED ORDER — MORPHINE SULFATE (CONCENTRATE) 10 MG/0.5ML PO SOLN
10.0000 mg | ORAL | Status: AC | PRN
Start: 2019-12-22 — End: ?

## 2019-12-22 MED ORDER — GLYCOPYRROLATE 1 MG PO TABS: 1.0000 mg | ORAL_TABLET | Freq: Three times a day (TID) | ORAL | Status: AC | PRN

## 2019-12-22 MED ORDER — ACETAMINOPHEN 325 MG PO TABS: 650.0000 mg | ORAL_TABLET | Freq: Four times a day (QID) | ORAL | Status: AC | PRN

## 2019-12-22 MED ORDER — LORAZEPAM 0.5 MG PO TABS: 0.5000 mg | ORAL_TABLET | Freq: Four times a day (QID) | ORAL | 0 refills | Status: AC | PRN

## 2019-12-22 NOTE — Care Management Important Message (Signed)
Important Message  Patient Details  Name: Savannah Galvan MRN: [id number] Date of Birth: 1929/10/14   Medicare Important Message Given:  Yes     Dannette Barbara 12/22/2019, 12:59 PM

## 2019-12-22 NOTE — Discharge Summary (Signed)
hPhysician Discharge Summary  Savannah Galvan QJJ:[id number] DOB: [birth year] DOA: 12/19/2019  PCP: Clide Deutscher, PA  Admit date: 12/19/2019 Discharge date: 12/22/2019  Admitted From: home Disposition:  Hospice, [facility]  Recommendations for Outpatient Follow-up:  1. [place]: n/a  Equipment/Devices: n/a   Discharge Condition: Guarded  CODE STATUS: DNR  Diet recommendation:  Per patient comfort  Discharge Diagnoses: Principal Problem:   Acute respiratory failure with hypoxia and hypercapnia (HCC) Active Problems:   Metastatic breast cancer ([place])   Hydropneumothorax   Bilateral pleural effusion   Hypertension   Depression   Elevated troponin   Swelling in right armpit   HCAP (healthcare-associated pneumonia)   Severe sepsis with septic shock (HCC)   Arm DVT (deep venous thromboembolism), chronic, right ([place])   Pressure injury of skin   Hospice care patient    Summary of HPI and Hospital Course:  From H&P by Dr. Blaine Hamper 9/24:  "HPI: Savannah Galvan is a 86 y.o. female with medical history significant of hypertension, hyperlipidemia, OSA, CKD stage II, breast cancer (s/p of mastectomy), hydropneumothorax (s/p of right chest tube placement), right arm DVT on Eliquis, hypothyroidism, depression, who presents with shortness of breath.   Patient was recently hospitalized from 8/25-8/31 due to right-sided hydropneumothorax after mastectomy. Pt is s/p of right chest tube placement. Pt has shortness of breath in the past several days, which has been progressively worsening.  Patient has dry cough, no chest pain.  No fever or chills.  Does not have nausea, vomiting, diarrhea or abdominal pain no symptoms of UTI.  No unilateral weakness. Patient was found to have oxygen desaturation to 65% on room air, which improved to 99% on BiPAP. Patient has a history of right arm DVT on Eliquis.  She still has right arm swelling.     ED Course: pt was found to have WBC 12.8, troponin I  54, BMP 126, negative COVID-19 PCR, lactic acid 4.7, electrolytes renal function okay, temperature 97.6, blood pressure 123/83, tachycardia with heart rate 113, tachypnea with RR 34.  ABG with pH 7.16, PCO2 73, pending PaO2. Patient is admitted to progressive bed as inpatient.   Chest x-ray showed: 1. Moderate to large bilateral pleural effusions, slightly increased from prior. 2. Patchy bibasilar opacities which may reflect atelectasis and/or pneumonia, progressed from prior."     Hospice Care Patient - patient is clinically stable for discharge to [facility] today.     Acute respiratory failure with hypoxia and hypercapnia:Likely multifactorial etiology, including bilateral pleural effusions and possible HCAP.  Patient was treated with empiric Cefepime.  Bronchodilators PRN.  BiPAP as needed.  O2 as needed, maintain O2 sat > 90%.  Mucinex.  Monitor Morphine PRN air hunger/dyspnea.  Severe sepsis with septic shockdue to possible HCAP - severe sepsis with septic shock POA as evidenced by leukocytosis, tachycardia, tachypnea, lactic acid elevated 4.7 in the setting of probable pneumonia. Hemodynamically stable.   --Treated w empiric IV antibiotics as above  Metastatic breast cancer - stopped letrozole.   Transition to hospice/comfort care.  Bilateral malignant pleural effusion: s/p R PleurX catheter, followed by Dr. Faith Rogue.   Catheter currently not functioning and won't drain.    Hypertension - stopped losartan  Depression - continue Remeron  Elevated troponin due to demand ischemia - Troponin 54>> 497.  Cardiology was consulted.  Occurred in setting of respiratory distress and hypoxia, consistent with demand ischemia. Patient without chest pain or ischemic ECG changes.  Right Upper DVT & edema -  stopped Eliquis       Discharge Instructions   Discharge Instructions    Call MD for:   Complete by: As directed    Any signs of discomfort pain or distress.   Diet - low  sodium heart healthy   Complete by: As directed    Increase activity slowly   Complete by: As directed    No wound care   Complete by: As directed      Allergies as of 12/22/2019      Reactions   Demerol [meperidine] Nausea And Vomiting      Medication List    STOP taking these medications   Eliquis 5 MG Tabs tablet Generic drug: apixaban   letrozole 2.5 MG tablet Commonly known as: FEMARA   losartan 50 MG tablet Commonly known as: COZAAR   multivitamin tablet   NUTRITIONAL SHAKE PLUS PROTEIN PO   palbociclib 100 MG tablet Commonly known as: Ibrance   vitamin B-12 1000 MCG tablet Commonly known as: CYANOCOBALAMIN     TAKE these medications   acetaminophen 325 MG tablet Commonly known as: TYLENOL Take 2 tablets (650 mg total) by mouth every 6 (six) hours as needed for fever, headache or moderate pain.   albuterol (2.5 MG/3ML) 0.083% nebulizer solution Commonly known as: PROVENTIL Take 3 mLs (2.5 mg total) by nebulization every 4 (four) hours as needed for wheezing or shortness of breath.   dextromethorphan-guaiFENesin 30-600 MG 12hr tablet Commonly known as: MUCINEX DM Take 1 tablet by mouth 2 (two) times daily as needed for cough.   glycopyrrolate 1 MG tablet Commonly known as: Robinul Take 1 tablet (1 mg total) by mouth 3 (three) times daily as needed (secretions).   LORazepam 0.5 MG tablet Commonly known as: Ativan Take 1 tablet (0.5 mg total) by mouth every 6 (six) hours as needed for anxiety (or agitation).   mirtazapine 7.5 MG tablet Commonly known as: REMERON Take by mouth.   morphine CONCENTRATE 10 MG/0.5ML Soln concentrated solution Take 0.5 mLs (10 mg total) by mouth every 2 (two) hours as needed for moderate pain, severe pain or shortness of breath (discomfort).   Myrbetriq 50 MG Tb24 tablet Generic drug: mirabegron ER Take 50 mg by mouth daily.       Allergies  Allergen Reactions  . Demerol [Meperidine] Nausea And Vomiting     Consultations:  Cardiology    Procedures/Studies: DG Chest 2 View  Result Date: 12/15/2019 CLINICAL DATA:  Pneumothorax. EXAM: CHEST - 2 VIEW COMPARISON:  December 08, 2019. FINDINGS: Stable cardiomediastinal silhouette. Stable position of right-sided chest tube. No definite pneumothorax is noted. Stable moderate size bilateral pleural effusions are noted with underlying atelectasis. Bony thorax is unremarkable. IMPRESSION: Stable position of right-sided chest tube. Stable moderate size bilateral pleural effusions with underlying atelectasis. No definite pneumothorax is noted. Electronically Signed   By: Marijo Conception M.D.   On: 12/15/2019 08:42   DG Chest 2 View  Result Date: 12/08/2019 CLINICAL DATA:  Follow-up pneumothorax. EXAM: CHEST - 2 VIEW COMPARISON:  11/25/2019 FINDINGS: A right-sided tunneled pleural catheter remains in place with its tip now positioned posteriorly in the mid hemithorax (previously coiled in the lung base). Aortic atherosclerosis is noted. The cardiac silhouette is partially obscured but grossly normal in size. There are moderate bilateral pleural effusions which have increased in size, particularly on the right. There is associated atelectasis in the lung bases. No residual pneumothorax is identified. Surgical clips are noted along the right lateral chest wall/axilla.  IMPRESSION: 1. Interval enlargement of moderate bilateral pleural effusions with change in positioning of the right tunneled pleural catheter. 2. No residual pneumothorax. Electronically Signed   By: Logan Bores M.D.   On: 12/08/2019 16:54   CT CHEST WO CONTRAST  Result Date: 12/19/2019 CLINICAL DATA:  Pleural effusion, malignancy suspected 86 year old with bilateral pleural effusion, hydropneumothorax status post chest tube placement on the right. Measure concurrent bilateral pleural effusion. Respiratory distress. Post mastectomy. EXAM: CT CHEST WITHOUT CONTRAST TECHNIQUE: Multidetector CT  imaging of the chest was performed following the standard protocol without IV contrast. COMPARISON:  Multiple prior radiographs.  PET CT 10/21/2019. FINDINGS: Cardiovascular: Aortic atherosclerosis. There are coronary artery calcifications. Upper normal heart size. No pericardial effusion. Mediastinum/Nodes: Evaluation for adenopathy is limited in the absence of contrast and paucity of mediastinal fat. There is no obvious bulky mediastinal adenopathy. Small lymph nodes on prior PET CT are not definitively visualized on the current exam. Surgical clips in the right axilla without evidence of axillary adenopathy. Esophagus is slightly patulous. No obvious esophageal wall thickening. Lungs/Pleura: Large right pleural effusion with PleurX catheter in place, tip in the posterior pleural space. There is no obvious pleural soft tissue density. No extrapleural air. There is complete compressive atelectasis of the right lower lobe, mild compressive atelectasis of the right middle lobe and moderate dependent atelectasis in the right upper lobe. 3 mm subpleural nodule in the right upper lobe, series 3, image 49. 6 mm right middle lobe pulmonary nodule, series 3, image 82. Large left pleural effusion with near complete compressive atelectasis of the left lower lobe moderate compressive atelectasis of the left upper lobe. Previously described perifissural nodule in the lingula is not definitively seen on the current exam. There is mild apical septal thickening. Retained mucus within the trachea. Upper Abdomen: Low-density lesions in the liver appears similar to prior PET and likely represent cysts. Paucity of intra-abdominal fat limits detailed upper abdominal assessment. Musculoskeletal: There is right chest wall skin thickening and subcutaneous edema. Right axillary skin thickening is partially included. Bones are diffusely under mineralized. No evidence of focal bone lesion, motion limits detailed assessment. Paucity of body  fat suggesting cachexia. IMPRESSION: 1. Large right pleural effusion with PleurX catheter in place, tip in the posterior pleural space. No extrapleural air/pneumothorax. Complete compressive atelectasis of the right lower lobe with moderate atelectasis of the right upper lobe. 2. Large left pleural effusion with near complete compressive atelectasis of the left lower lobe and moderate compressive atelectasis of the right upper and middle lobes. 3. Right chest wall skin thickening and subcutaneous edema. Right axillary skin thickening is partially included. 4. Small pulmonary nodules in the right lung, largest measuring 6 mm in the right middle lobe. These appear new from prior PET, suspicious for metastasis. The previous lingular nodule is not definitively seen. 5. Mild apical septal thickening, can be seen with pulmonary edema. 6. Retained mucus in the trachea. Aortic Atherosclerosis (ICD10-I70.0). Electronically Signed   By: Keith Rake M.D.   On: 12/19/2019 17:06   DG Chest Portable 1 View  Result Date: 12/19/2019 CLINICAL DATA:  Shortness of breath EXAM: PORTABLE CHEST 1 VIEW COMPARISON:  12/15/2019, 12/08/2019 FINDINGS: Moderate to large bilateral pleural effusions, which appears slightly increased from prior. Right basilar pleural drainage catheter remains in place. Patchy bibasilar opacities, progressed. No pneumothorax. Cardiac silhouette is obscured. Aortic atherosclerosis. IMPRESSION: 1. Moderate to large bilateral pleural effusions, slightly increased from prior. 2. Patchy bibasilar opacities which may reflect atelectasis and/or pneumonia,  progressed from prior. Electronically Signed   By: Davina Poke D.O.   On: 12/19/2019 10:07   DG Chest Port 1 View  Result Date: 11/25/2019 CLINICAL DATA:  Postoperative evaluation.  Right chest tube. EXAM: PORTABLE CHEST 1 VIEW COMPARISON:  11/24/2019. FINDINGS: Right chest tube again noted with tip now noted over the right lower chest. Persistent but  improved right-sided pneumothorax. Heart size stable. Bilateral pulmonary infiltrates/edema noted on today's exam. Bibasilar atelectasis. Bilateral pleural effusions again noted. Surgical clips right chest. IMPRESSION: 1. Right chest tube again noted with tip now noted over the right lower chest. Persistent but improved right-sided pneumothorax. 2. Bilateral pulmonary infiltrates/edema noted on today's exam. Bibasilar atelectasis. Bilateral pleural effusions again noted. Electronically Signed   By: Marcello Moores  Register   On: 11/25/2019 07:44   DG Chest Port 1 View  Result Date: 11/24/2019 CLINICAL DATA:  Chest tube placement. EXAM: PORTABLE CHEST 1 VIEW COMPARISON:  Same day. FINDINGS: Stable cardiomediastinal silhouette. Right pleural drainage catheter appears to been repositioned with tip directed more superiorly. Mild right apical pneumothorax is noted which is significantly decreased compared to prior exam. Decreased right pleural effusion is noted. Stable left pleural effusion is noted. Bony thorax is unremarkable. IMPRESSION: Right pleural drainage catheter appears to been repositioned with tip directed more superiorly. Mild right apical pneumothorax is noted which is significantly decreased compared to prior exam. Decreased right pleural effusion is noted. Stable left pleural effusion. Aortic Atherosclerosis (ICD10-I70.0). Electronically Signed   By: Marijo Conception M.D.   On: 11/24/2019 13:53   DG Chest Port 1 View  Result Date: 11/24/2019 CLINICAL DATA:  Status post chest tube placement. EXAM: PORTABLE CHEST 1 VIEW COMPARISON:  11/20/2019 FINDINGS: Interval progression of right pneumothorax, now moderate in size. Right pleural drain overlies the lower right hemithorax. Persistent bibasilar collapse/consolidative opacity with bilateral pleural effusions. Cardiopericardial silhouette is at upper limits of normal for size. The visualized bony structures of the thorax show now acute abnormality. Telemetry  leads overlie the chest. IMPRESSION: 1. Interval progression of right pneumothorax, now moderate in size. Right pleural drain overlies the lower right hemithorax. 2. Persistent bibasilar collapse/consolidative opacity with bilateral pleural effusions. Electronically Signed   By: Misty Stanley M.D.   On: 11/24/2019 11:24       Subjective: Patient seen with niece at bedside this AM.  Patient appears more comfortable today, resting peacefully and not in any respiratory distress.  She wakes up briefly, denies pain or currently feeling short of breath.   Discharge Exam: Vitals:   12/22/19 0723 12/22/19 1157  BP: 124/79 110/74  Pulse: 98 88  Resp: 18 16  Temp: 97.8 F (36.6 C) 97.8 F (36.6 C)  SpO2: 93% 93%   Vitals:   12/21/19 1931 12/22/19 0545 12/22/19 0723 12/22/19 1157  BP: (!) 141/85 122/82 124/79 110/74  Pulse: 91 95 98 88  Resp: 18 19 18 16   Temp: 97.8 F (36.6 C) (!) 97.5 F (36.4 C) 97.8 F (36.6 C) 97.8 F (36.6 C)  TempSrc:  Oral Axillary   SpO2: 100% 92% 93% 93%  Weight:      Height:        General: Pt is alert, awake, not in acute distress Cardiovascular: RRR, S1/S2 +, no rubs, no gallops Respiratory: diminished laterally, clear anteriorly with occasional coarse upper airway sounds Abdominal: Soft, NT, ND, bowel sounds + Extremities: no edema, no cyanosis    The results of significant diagnostics from this hospitalization (including imaging, microbiology, ancillary and  laboratory) are listed below for reference.     Microbiology: Recent Results (from the past 240 hour(s))  Respiratory Panel by RT PCR (Flu A&B, Covid) - Nasopharyngeal Swab     Status: None   Collection Time: 12/19/19  9:52 AM   Specimen: Nasopharyngeal Swab  Result Value Ref Range Status   SARS Coronavirus 2 by RT PCR NEGATIVE NEGATIVE Final    Comment: (NOTE) SARS-CoV-2 target nucleic acids are NOT DETECTED.  The SARS-CoV-2 RNA is generally detectable in upper respiratoy specimens  during the acute phase of infection. The lowest concentration of SARS-CoV-2 viral copies this assay can detect is 131 copies/mL. A negative result does not preclude SARS-Cov-2 infection and should not be used as the sole basis for treatment or other patient management decisions. A negative result may occur with  improper specimen collection/handling, submission of specimen other than nasopharyngeal swab, presence of viral mutation(s) within the areas targeted by this assay, and inadequate number of viral copies (<131 copies/mL). A negative result must be combined with clinical observations, patient history, and epidemiological information. The expected result is Negative.  Fact Sheet for Patients:  PinkCheek.be  Fact Sheet for Healthcare Providers:  GravelBags.it  This test is no t yet approved or cleared by the [place] FDA and  has been authorized for detection and/or diagnosis of SARS-CoV-2 by FDA under an Emergency Use Authorization (EUA). This EUA will remain  in effect (meaning this test can be used) for the duration of the COVID-19 declaration under Section 564(b)(1) of the Act, 21 U.S.C. section 360bbb-3(b)(1), unless the authorization is terminated or revoked sooner.     Influenza A by PCR NEGATIVE NEGATIVE Final   Influenza B by PCR NEGATIVE NEGATIVE Final    Comment: (NOTE) The Xpert Xpress SARS-CoV-2/FLU/RSV assay is intended as an aid in  the diagnosis of influenza from Nasopharyngeal swab specimens and  should not be used as a sole basis for treatment. Nasal washings and  aspirates are unacceptable for Xpert Xpress SARS-CoV-2/FLU/RSV  testing.  Fact Sheet for Patients: PinkCheek.be  Fact Sheet for Healthcare Providers: GravelBags.it  This test is not yet approved or cleared by the [place] FDA and  has been authorized for detection and/or  diagnosis of SARS-CoV-2 by  FDA under an Emergency Use Authorization (EUA). This EUA will remain  in effect (meaning this test can be used) for the duration of the  Covid-19 declaration under Section 564(b)(1) of the Act, 21  U.S.C. section 360bbb-3(b)(1), unless the authorization is  terminated or revoked. Performed at [facility], [address] [place], [place] [address]   CULTURE, BLOOD (ROUTINE X 2) w Reflex to ID Panel     Status: None (Preliminary result)   Collection Time: 12/19/19 12:35 PM   Specimen: BLOOD  Result Value Ref Range Status   Specimen Description BLOOD LEFT UPPER ARM  Final   Special Requests   Final    BOTTLES DRAWN AEROBIC AND ANAEROBIC Blood Culture results may not be optimal due to an excessive volume of blood received in culture bottles   Culture   Final    NO GROWTH 3 DAYS Performed at [facility], [address] [place], [place] [address]    Report Status PENDING  Incomplete  CULTURE, BLOOD (ROUTINE X 2) w Reflex to ID Panel     Status: None (Preliminary result)   Collection Time: 12/19/19 12:36 PM   Specimen: BLOOD  Result Value Ref Range Status   Specimen Description BLOOD  LEFT FORE ARM  Final   Special Requests   Final    BOTTLES DRAWN AEROBIC AND ANAEROBIC Blood Culture adequate volume   Culture   Final    NO GROWTH 3 DAYS Performed at [facility], [address] [place], [place] [address]    Report Status PENDING  Incomplete  MRSA PCR Screening     Status: None   Collection Time: 12/19/19  8:25 PM   Specimen: Nasal Mucosa; Nasopharyngeal  Result Value Ref Range Status   MRSA by PCR NEGATIVE NEGATIVE Final    Comment:        The GeneXpert MRSA Assay (FDA approved for NASAL specimens only), is one component of a comprehensive MRSA colonization surveillance program. It is not intended to diagnose MRSA infection nor to guide or monitor treatment for MRSA infections. Performed at [facility],  [address] [place], [place] [address]      Labs: BNP (last 3 results) Recent Labs    12/19/19 0954  BNP 209.4*   Basic Metabolic Panel: Recent Labs  Lab 12/19/19 0954 12/20/19 0459  NA 140 142  K 3.8 4.1  CL 103 108  CO2 22 20*  GLUCOSE 266* 161*  BUN 10 21  CREATININE 0.91 0.91  CALCIUM 9.2 8.9   Liver Function Tests: Recent Labs  Lab 12/19/19 0954 12/19/19 1316  AST 36  --   ALT 30  --   ALKPHOS 79  --   BILITOT 0.6  --   PROT 6.5 6.7  ALBUMIN 3.1*  --    No results for input(s): LIPASE, AMYLASE in the last 168 hours. No results for input(s): AMMONIA in the last 168 hours. CBC: Recent Labs  Lab 12/19/19 0954 12/20/19 0459  WBC 12.8* 8.1  NEUTROABS 6.5  --   HGB 15.7* 12.1  HCT 49.7* 37.4  MCV 97.8 96.4  PLT 353 250   Cardiac Enzymes: No results for input(s): CKTOTAL, CKMB, CKMBINDEX, TROPONINI in the last 168 hours. BNP: Invalid input(s): POCBNP CBG: No results for input(s): GLUCAP in the last 168 hours. D-Dimer No results for input(s): DDIMER in the last 72 hours. Hgb A1c Recent Labs    12/20/19 0459  HGBA1C 6.2*   Lipid Profile Recent Labs    12/20/19 0459  CHOL 197  HDL 66  LDLCALC 115*  TRIG 80  CHOLHDL 3.0   Thyroid function studies No results for input(s): TSH, T4TOTAL, T3FREE, THYROIDAB in the last 72 hours.  Invalid input(s): FREET3 Anemia work up No results for input(s): VITAMINB12, FOLATE, FERRITIN, TIBC, IRON, RETICCTPCT in the last 72 hours. Urinalysis No results found for: COLORURINE, APPEARANCEUR, [place], [place], [place], [place], [place], [place], PROTEINUR, UROBILINOGEN, NITRITE, LEUKOCYTESUR Sepsis Labs Invalid input(s): PROCALCITONIN,  WBC,  LACTICIDVEN Microbiology Recent Results (from the past 240 hour(s))  Respiratory Panel by RT PCR (Flu A&B, Covid) - Nasopharyngeal Swab     Status: None   Collection Time: 12/19/19  9:52 AM   Specimen: Nasopharyngeal Swab  Result Value Ref Range Status    SARS Coronavirus 2 by RT PCR NEGATIVE NEGATIVE Final    Comment: (NOTE) SARS-CoV-2 target nucleic acids are NOT DETECTED.  The SARS-CoV-2 RNA is generally detectable in upper respiratoy specimens during the acute phase of infection. The lowest concentration of SARS-CoV-2 viral copies this assay can detect is 131 copies/mL. A negative result does not preclude SARS-Cov-2 infection and should not be used as the sole basis for treatment or other patient management decisions. A negative result  may occur with  improper specimen collection/handling, submission of specimen other than nasopharyngeal swab, presence of viral mutation(s) within the areas targeted by this assay, and inadequate number of viral copies (<131 copies/mL). A negative result must be combined with clinical observations, patient history, and epidemiological information. The expected result is Negative.  Fact Sheet for Patients:  PinkCheek.be  Fact Sheet for Healthcare Providers:  GravelBags.it  This test is no t yet approved or cleared by the [place] FDA and  has been authorized for detection and/or diagnosis of SARS-CoV-2 by FDA under an Emergency Use Authorization (EUA). This EUA will remain  in effect (meaning this test can be used) for the duration of the COVID-19 declaration under Section 564(b)(1) of the Act, 21 U.S.C. section 360bbb-3(b)(1), unless the authorization is terminated or revoked sooner.     Influenza A by PCR NEGATIVE NEGATIVE Final   Influenza B by PCR NEGATIVE NEGATIVE Final    Comment: (NOTE) The Xpert Xpress SARS-CoV-2/FLU/RSV assay is intended as an aid in  the diagnosis of influenza from Nasopharyngeal swab specimens and  should not be used as a sole basis for treatment. Nasal washings and  aspirates are unacceptable for Xpert Xpress SARS-CoV-2/FLU/RSV  testing.  Fact Sheet for  Patients: PinkCheek.be  Fact Sheet for Healthcare Providers: GravelBags.it  This test is not yet approved or cleared by the [place] FDA and  has been authorized for detection and/or diagnosis of SARS-CoV-2 by  FDA under an Emergency Use Authorization (EUA). This EUA will remain  in effect (meaning this test can be used) for the duration of the  Covid-19 declaration under Section 564(b)(1) of the Act, 21  U.S.C. section 360bbb-3(b)(1), unless the authorization is  terminated or revoked. Performed at [facility], [address] [place], [place] [address]   CULTURE, BLOOD (ROUTINE X 2) w Reflex to ID Panel     Status: None (Preliminary result)   Collection Time: 12/19/19 12:35 PM   Specimen: BLOOD  Result Value Ref Range Status   Specimen Description BLOOD LEFT UPPER ARM  Final   Special Requests   Final    BOTTLES DRAWN AEROBIC AND ANAEROBIC Blood Culture results may not be optimal due to an excessive volume of blood received in culture bottles   Culture   Final    NO GROWTH 3 DAYS Performed at [facility], [address], [place], [place] [address]    Report Status PENDING  Incomplete  CULTURE, BLOOD (ROUTINE X 2) w Reflex to ID Panel     Status: None (Preliminary result)   Collection Time: 12/19/19 12:36 PM   Specimen: BLOOD  Result Value Ref Range Status   Specimen Description BLOOD LEFT FORE ARM  Final   Special Requests   Final    BOTTLES DRAWN AEROBIC AND ANAEROBIC Blood Culture adequate volume   Culture   Final    NO GROWTH 3 DAYS Performed at [facility], [address], [place], [place] [address]    Report Status PENDING  Incomplete  MRSA PCR Screening     Status: None   Collection Time: 12/19/19  8:25 PM   Specimen: Nasal Mucosa; Nasopharyngeal  Result Value Ref Range Status   MRSA by PCR NEGATIVE NEGATIVE Final    Comment:        The GeneXpert MRSA Assay  (FDA approved for NASAL specimens only), is one component of a comprehensive MRSA colonization surveillance program. It is not intended to diagnose MRSA infection nor to  guide or monitor treatment for MRSA infections. Performed at [facility], [address] [place], [place] [address]      Time coordinating discharge: Over 30 minutes  SIGNED:   Ezekiel Slocumb, DO [facility] 12/22/2019, 12:09 PM   If 7PM-7AM, please contact night-coverage [email or web address]

## 2019-12-22 NOTE — Progress Notes (Signed)
Report called to [facility]. IV and tele removed. EMS to transport.

## 2019-12-22 NOTE — Progress Notes (Signed)
[facility] hospital Liaison note:  New referral for [facility] inpatient unit received from Attending physician Dr. Arbutus Ped. Patient information sent to referral. Hospice eligibility has been confirmed.  Writer met in the room with patient's HCPOA grand niece Jinny Blossom to initiate education regarding hospice services, philosophy and team approach to care with understanding voiced.  A bed is available today at [facility] in [place], family has accepted this bed. Hospital care team updated. Plan is for discharge to [facility] today with 2 pm transport arranged by TOC Ginnie Russoli.. Signed out of facility DNR in place in patient's chart. Staff RN to call report to [phone]. Thank you for the opportunity to be involved in the care of this patient and her family. Flo Shanks BSN, RN, [facility] [phone]

## 2019-12-23 ENCOUNTER — Telehealth: Payer: Self-pay | Admitting: Oncology

## 2019-12-23 ENCOUNTER — Ambulatory Visit: Payer: Medicare Other

## 2019-12-23 ENCOUNTER — Telehealth: Payer: Self-pay | Admitting: *Deleted

## 2019-12-23 LAB — BLOOD GAS, VENOUS
Acid-base deficit: 4.7 mmol/L — ABNORMAL HIGH (ref 0.0–2.0)
Acid-base deficit: 2 mmol/L (ref 0.0–2.0)
Bicarbonate: 26 mmol/L (ref 20.0–28.0)
Bicarbonate: 27.2 mmol/L (ref 20.0–28.0)
O2 Saturation: 40.9 %
O2 Saturation: 28.4 %
Patient temperature: 37
Patient temperature: 37
pCO2, Ven: 73 mmHg (ref 44.0–60.0)
pCO2, Ven: 65 mmHg — ABNORMAL HIGH (ref 44.0–60.0)
pH, Ven: 7.16 — CL (ref 7.250–7.430)
pH, Ven: 7.23 — ABNORMAL LOW (ref 7.250–7.430)

## 2019-12-23 NOTE — Telephone Encounter (Signed)
Called and spoke grand niece who is POA and checking on pt.got a message that she was sent to hospice home. Meghan said that she got a call from home place that she is not herself and does not look good. Meghan said that she was having a strain on her heart, fluid accumulation and then just stopped talking and now no response and based on how she looks ; Meghan said 2-3 days before she passes away. I told her to call me if there is anything we can do.

## 2019-12-23 NOTE — Telephone Encounter (Signed)
Patient transitioned to [facility] after being admitted to [facility] to let them know to inactivate patient and prescription on 12/22/19.  [facility] Phone [phone] Fax [phone] 12/23/2019 2:17 PM

## 2019-12-23 NOTE — Telephone Encounter (Signed)
Writer was informed via secure chat on this date that patient will be going to the [facility]. Patient had appts at the [facility] on 01-12-20, which have been cancelled. MD team made aware.

## 2019-12-24 LAB — CULTURE, BLOOD (ROUTINE X 2)
Culture: NO GROWTH
Culture: NO GROWTH
Special Requests: ADEQUATE

## 2019-12-29 ENCOUNTER — Encounter: Payer: Self-pay | Admitting: Oncology

## 2019-12-30 ENCOUNTER — Ambulatory Visit: Payer: Medicare Other

## 2019-12-31 ENCOUNTER — Ambulatory Visit: Payer: Medicare Other

## 2020-01-01 ENCOUNTER — Ambulatory Visit: Payer: Medicare Other

## 2020-01-02 ENCOUNTER — Ambulatory Visit: Payer: Medicare Other

## 2020-01-05 ENCOUNTER — Ambulatory Visit: Payer: Medicare Other

## 2020-01-06 ENCOUNTER — Ambulatory Visit: Payer: Medicare Other

## 2020-01-07 ENCOUNTER — Ambulatory Visit: Payer: Medicare Other

## 2020-01-08 ENCOUNTER — Ambulatory Visit: Payer: Medicare Other

## 2020-01-09 ENCOUNTER — Ambulatory Visit: Payer: Medicare Other

## 2020-01-12 ENCOUNTER — Ambulatory Visit: Payer: Medicare Other

## 2020-01-12 ENCOUNTER — Ambulatory Visit: Payer: Medicare Other | Admitting: Oncology

## 2020-01-12 ENCOUNTER — Other Ambulatory Visit: Payer: Medicare Other

## 2020-01-13 ENCOUNTER — Ambulatory Visit: Payer: Medicare Other

## 2020-01-26 DEATH — deceased

## 2021-04-25 IMAGING — US US THORACENTESIS ASP PLEURAL SPACE W/IMG GUIDE
1 series · 1 of 1 positions shown · non-contrast
Comparison: None.

CLINICAL DATA: Recurrent malignant right pleural effusion from
metastatic breast carcinoma.

EXAM:
ULTRASOUND GUIDED RIGHT THORACENTESIS

[Series 1: us thoracentesis asp pleural space w/img guide · 1 of 1 slices shown]
[im 1/1]
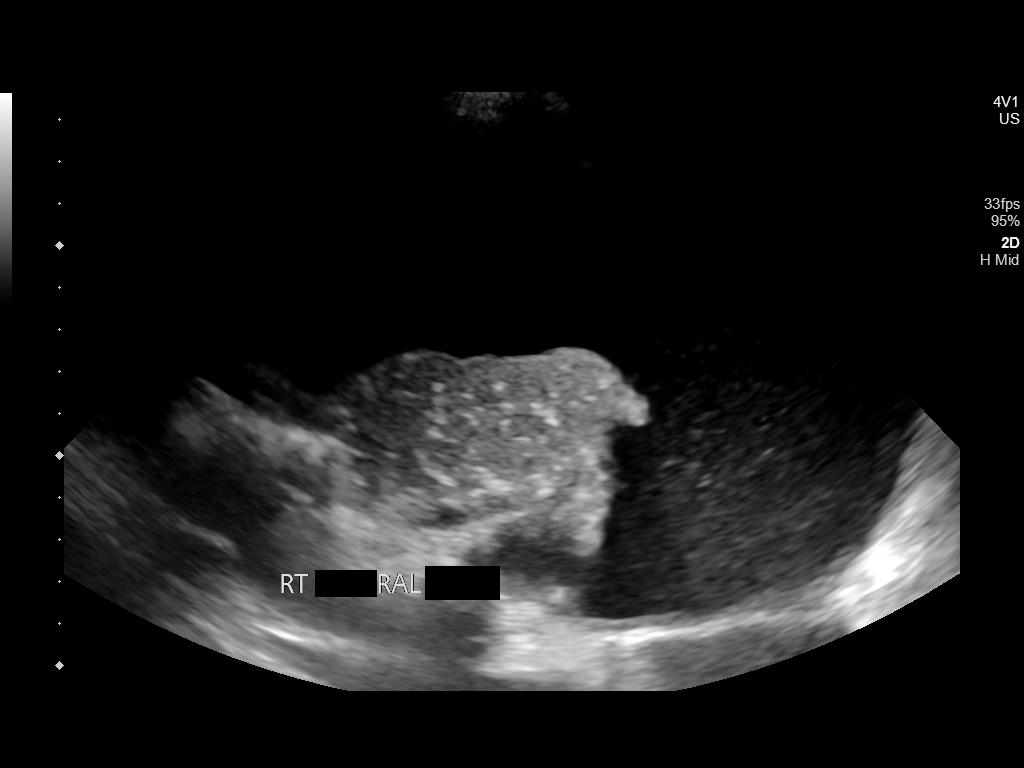

[1 of 1 positions shown; findings below may reference images not displayed]

PROCEDURE:
An ultrasound guided thoracentesis was thoroughly discussed with the
patient and questions answered. The benefits, risks, alternatives
and complications were also discussed. The patient understands and
wishes to proceed with the procedure. Written consent was obtained.

Ultrasound was performed to localize and mark an adequate pocket of
fluid in the right chest. The area was then prepped and draped in
the normal sterile fashion. 1% Lidocaine was used for local
anesthesia. Under ultrasound guidance a 19 gauge Yueh catheter was
introduced. Thoracentesis was performed. The catheter was removed
and a dressing applied.

COMPLICATIONS:
None
FINDINGS: A total of approximately 1.9 L of bloody fluid was removed. A fluid
sample wassent for cytologic analysis as requested.
IMPRESSION: Successful ultrasound guided right thoracentesis yielding 1.9 L of
pleural fluid.

## 2021-04-25 IMAGING — DX DG CHEST 1V
1 series · 1 of 1 positions shown · non-contrast
Comparison: 10/24/2019

CLINICAL DATA: Recurrent malignant right pleural effusion related
to metastatic breast carcinoma. Status post right thoracentesis.

EXAM:
CHEST  1 VIEW

[chest ap]
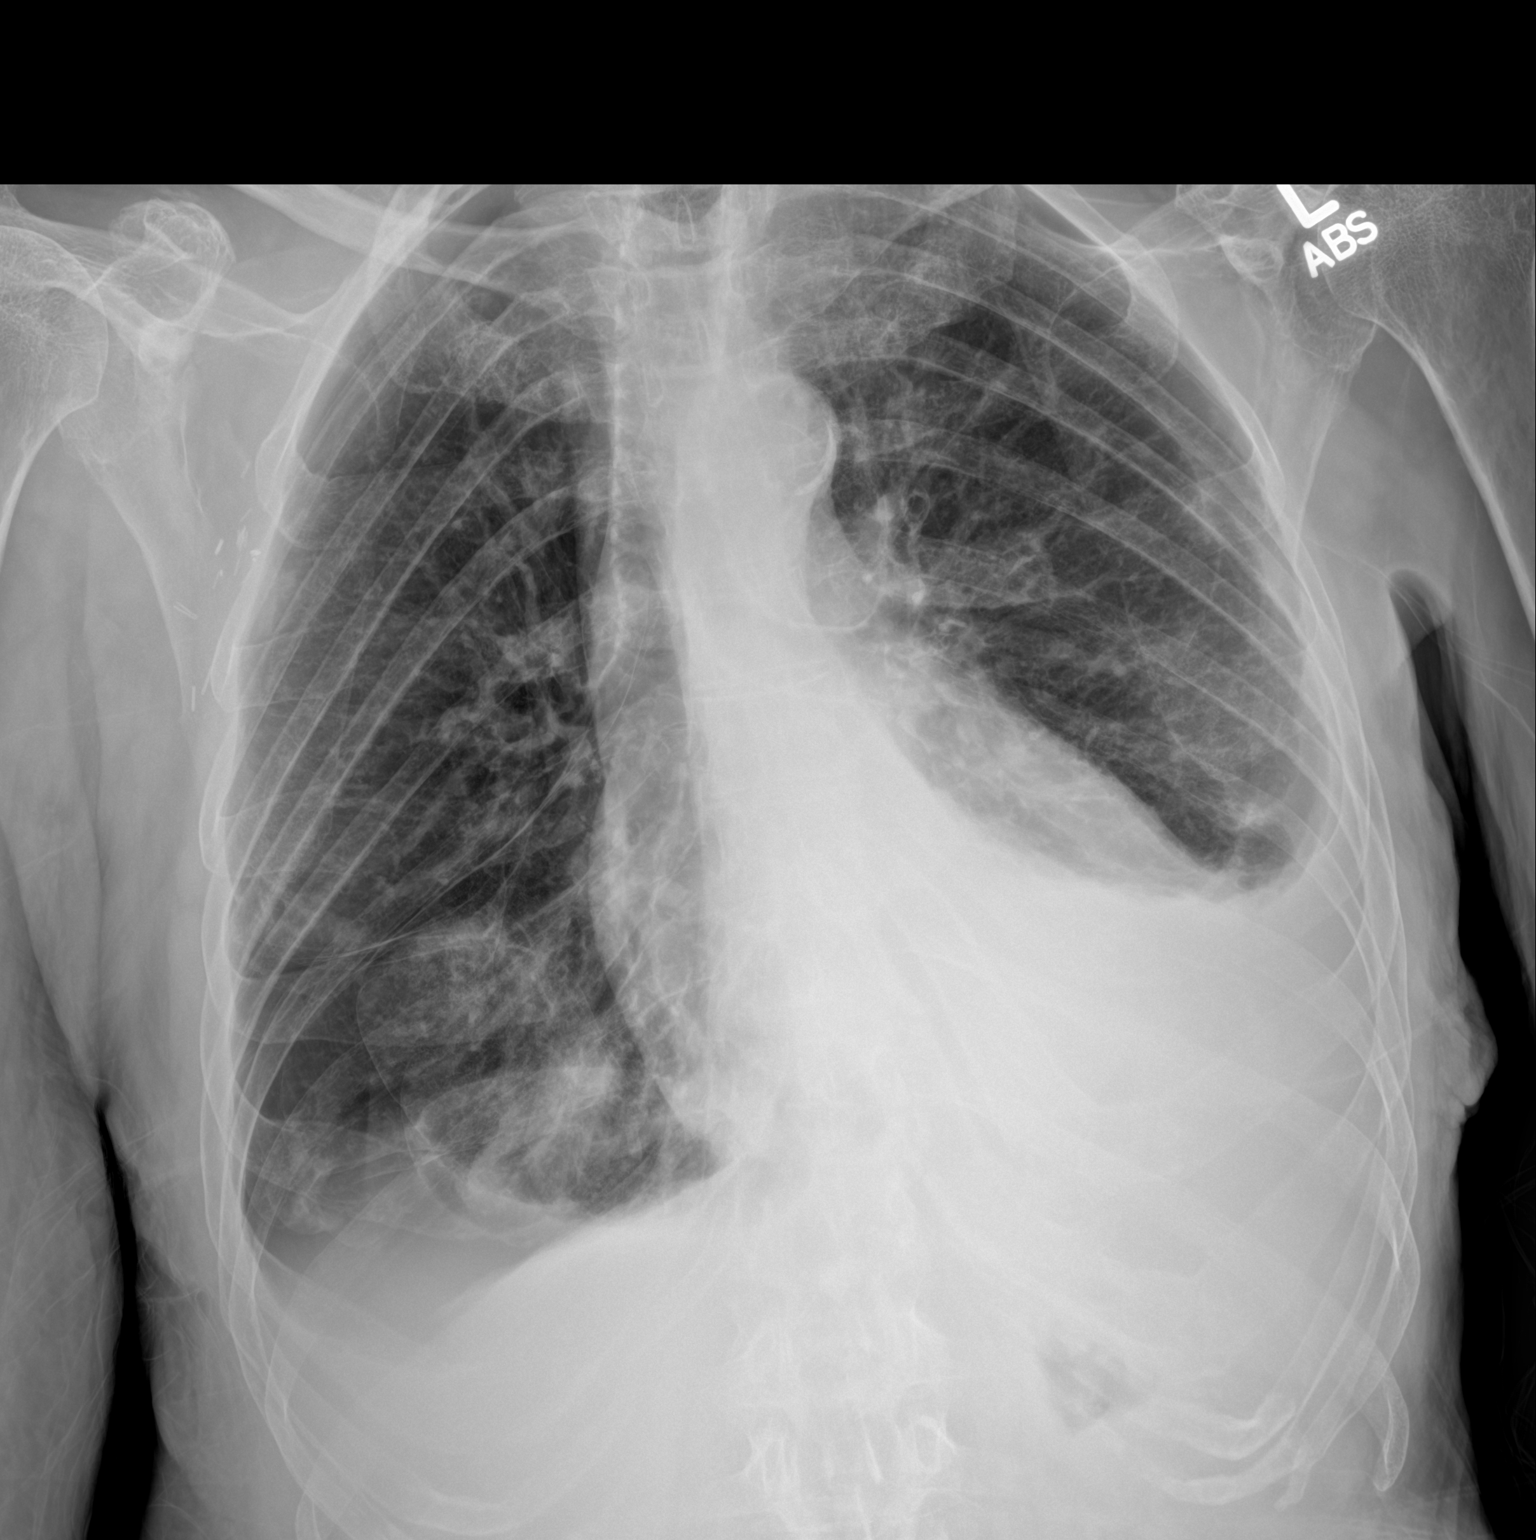

[1 of 1 positions shown; findings below may reference images not displayed]

FINDINGS: Stable heart size. Similar to the prior study, there is lack of
complete re-expansion of the right lung after thoracentesis,
especially along the lateral basilar aspect of the hemithorax. Small
amount of residual pleural fluid remains. There is a small to
moderate left pleural effusion. No pulmonary edema.
IMPRESSION: 1. Similar lack of complete re-expansion of the right lung after
thoracentesis. Small amount of residual pleural fluid remains.
2. Small to moderate left pleural effusion.

## 2021-05-03 IMAGING — CR DG CHEST 2V
2 series · 2 of 2 positions shown · non-contrast
Comparison: 11/11/2019

CLINICAL DATA: Shortness of breath

EXAM:
CHEST - 2 VIEW

[chest lat]
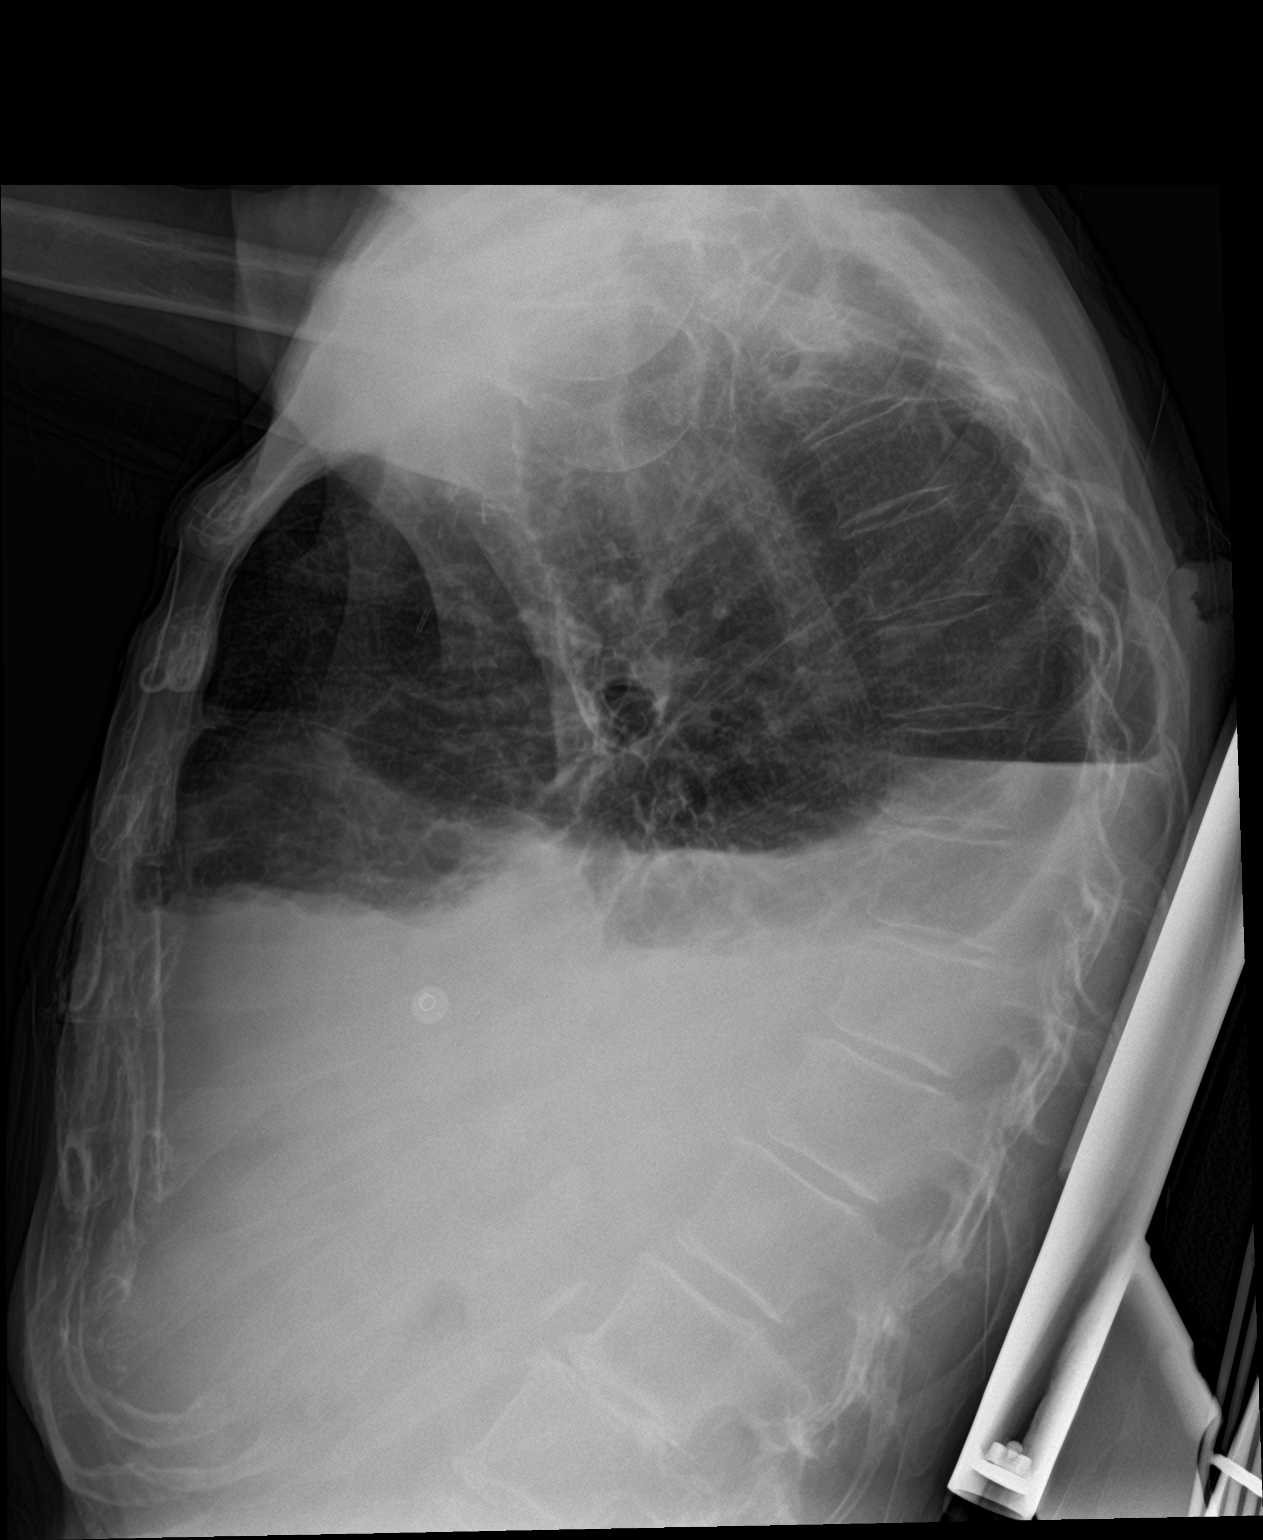

[chest ap]
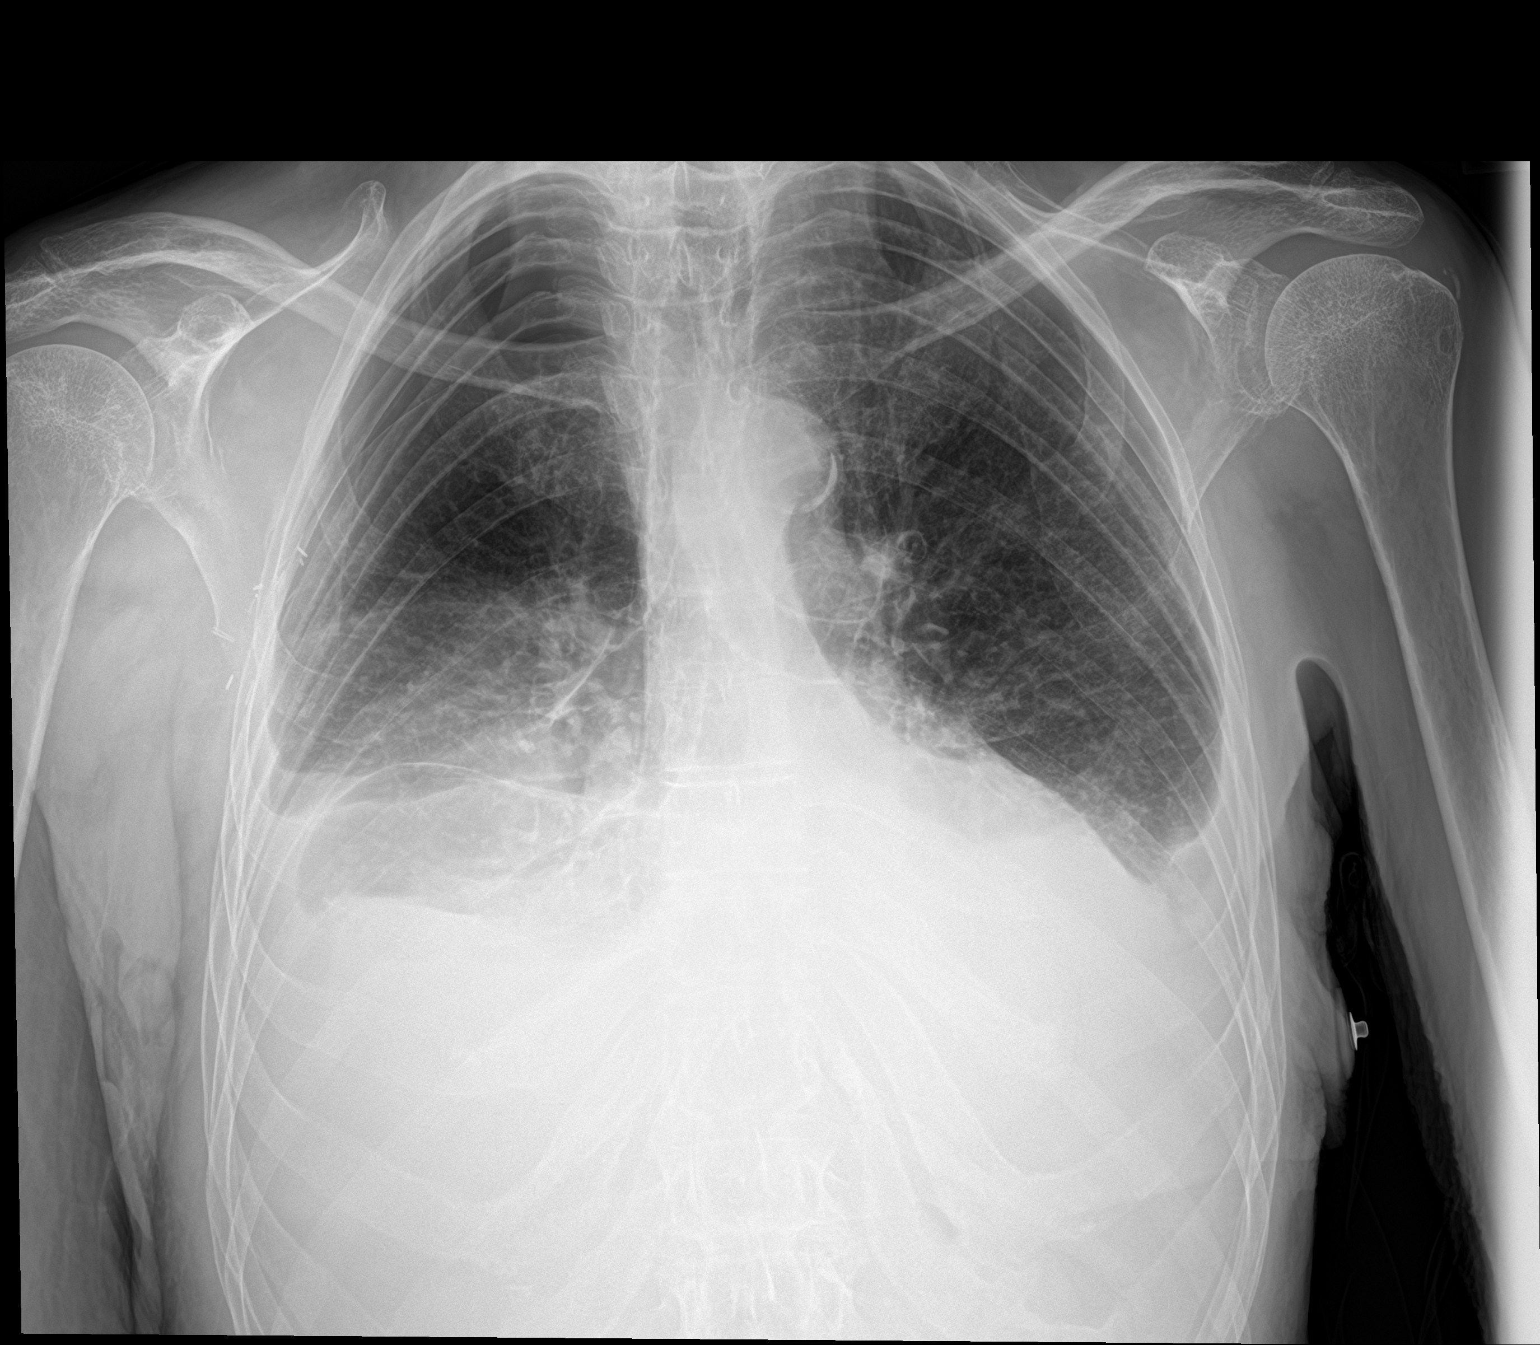

[2 of 2 positions shown; findings below may reference images not displayed]

FINDINGS: Small to moderate bilateral pleural effusions, increased on the
right. Superimposed pleural air on the right, which is also present
on the prior study though now more apical rather than basilar is.
Bibasilar atelectasis. Normal heart size. No acute osseous
abnormality.
IMPRESSION: Moderate right hydropneumothorax.

Similar small to moderate left pleural effusion.

Associated bilateral atelectasis.

## 2021-05-08 IMAGING — DX DG CHEST 1V PORT
1 series · 1 of 1 positions shown · non-contrast
Comparison: Same day.

CLINICAL DATA: Chest tube placement.

EXAM:
PORTABLE CHEST 1 VIEW

[chest ap]
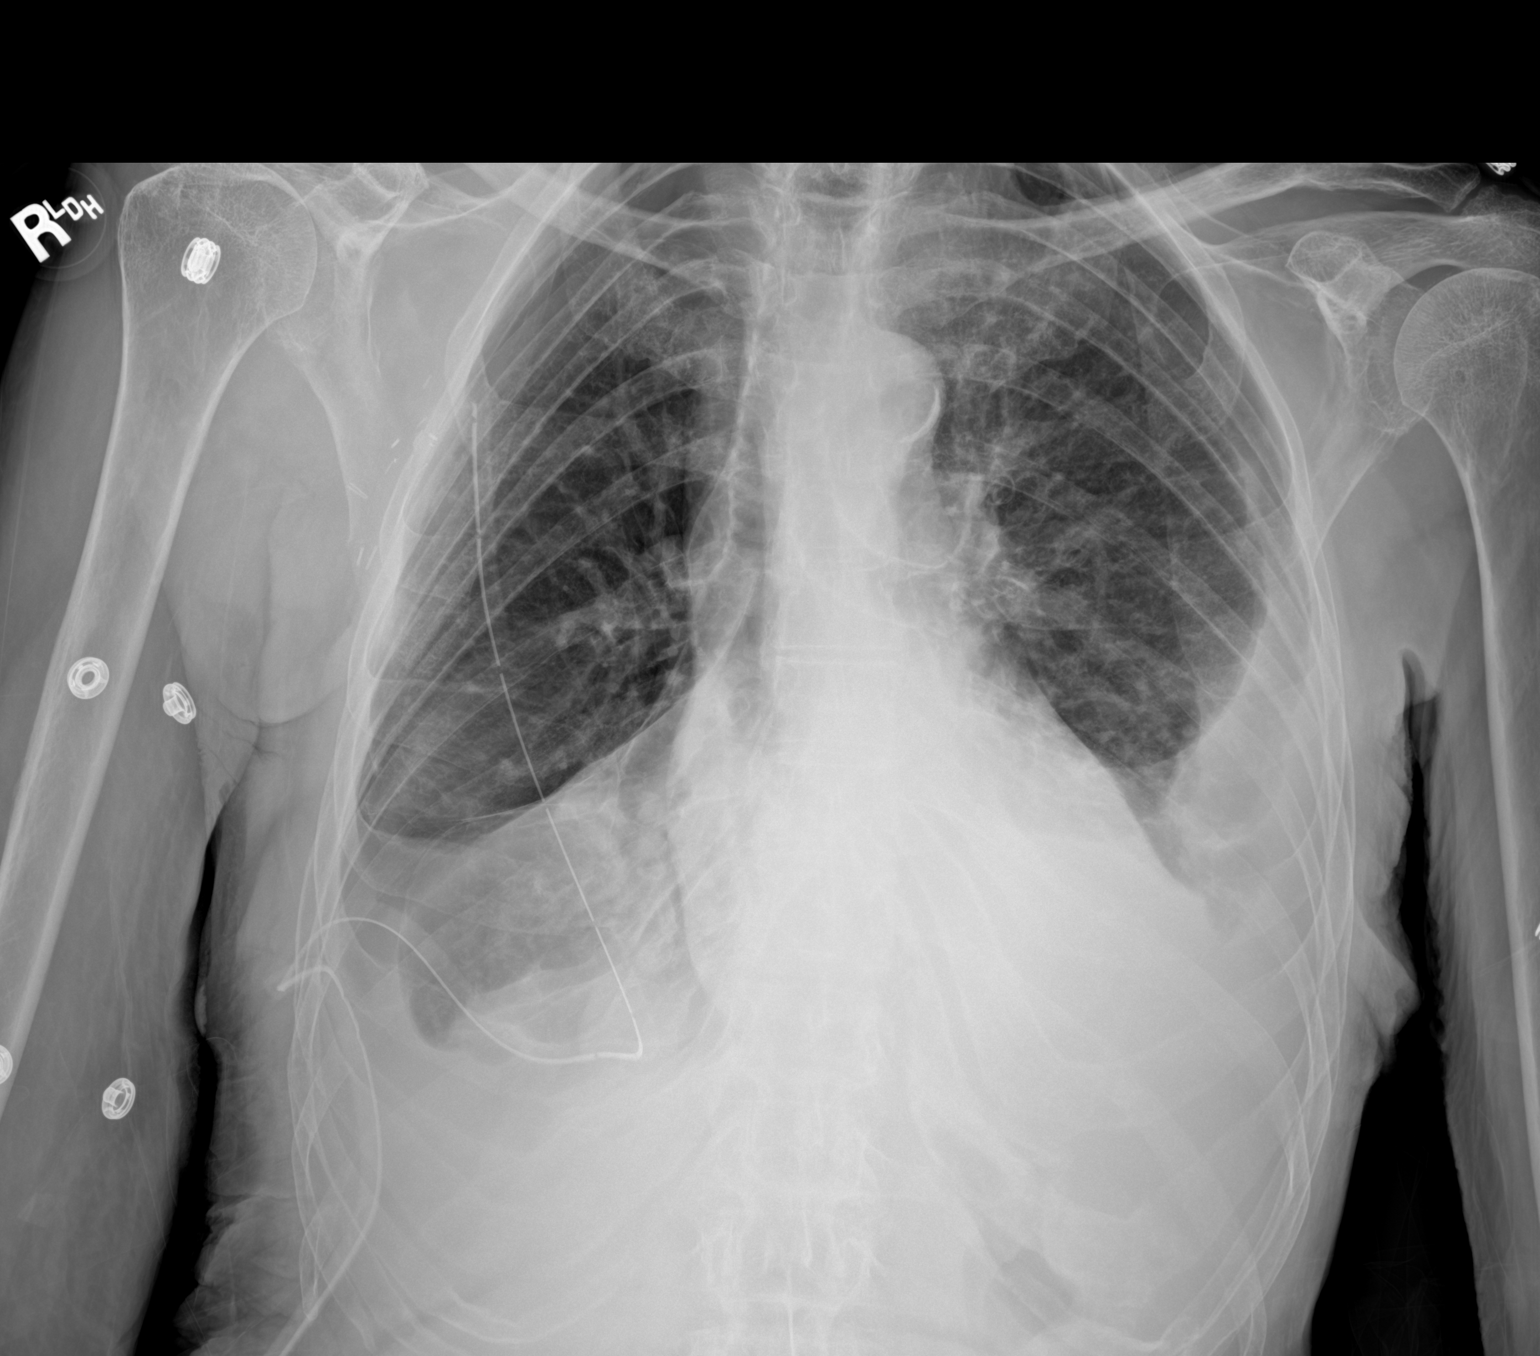

[1 of 1 positions shown; findings below may reference images not displayed]

FINDINGS: Stable cardiomediastinal silhouette. Right pleural drainage catheter
appears to been repositioned with tip directed more superiorly. Mild
right apical pneumothorax is noted which is significantly decreased
compared to prior exam. Decreased right pleural effusion is noted.
Stable left pleural effusion is noted. Bony thorax is unremarkable.
IMPRESSION: Right pleural drainage catheter appears to been repositioned with
tip directed more superiorly. Mild right apical pneumothorax is
noted which is significantly decreased compared to prior exam.
Decreased right pleural effusion is noted. Stable left pleural
effusion.

Aortic Atherosclerosis ([id number]).
